# Patient Record
Sex: Female | Born: 2000 | Race: Black or African American | Hispanic: No | Marital: Single | Smoking: Never smoker
Health system: Southern US, Community
[De-identification: ages and names within clinical notes are randomized; demographics above are authoritative.]

## PROBLEM LIST (undated history)

## (undated) DIAGNOSIS — F329 Major depressive disorder, single episode, unspecified: Secondary | ICD-10-CM

## (undated) DIAGNOSIS — F419 Anxiety disorder, unspecified: Secondary | ICD-10-CM

## (undated) DIAGNOSIS — Z789 Other specified health status: Secondary | ICD-10-CM

## (undated) DIAGNOSIS — F32A Depression, unspecified: Secondary | ICD-10-CM

## (undated) HISTORY — DX: Depression, unspecified: F32.A

## (undated) HISTORY — DX: Major depressive disorder, single episode, unspecified: F32.9

## (undated) HISTORY — DX: Anxiety disorder, unspecified: F41.9

---
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
## 2021-05-28 ENCOUNTER — Encounter: Payer: Self-pay | Admitting: Emergency Medicine

## 2021-05-28 ENCOUNTER — Emergency Department: Payer: BC Managed Care – PPO

## 2021-05-28 ENCOUNTER — Other Ambulatory Visit: Payer: Self-pay

## 2021-05-28 ENCOUNTER — Emergency Department
Admission: EM | Admit: 2021-05-28 | Discharge: 2021-05-28 | Disposition: A | Discharge status: Home / Self Care | Payer: BC Managed Care – PPO | Attending: Emergency Medicine | Admitting: Emergency Medicine

## 2021-05-28 DIAGNOSIS — Z7722 Contact with and (suspected) exposure to environmental tobacco smoke (acute) (chronic): Secondary | ICD-10-CM | POA: Insufficient documentation

## 2021-05-28 DIAGNOSIS — N12 Tubulo-interstitial nephritis, not specified as acute or chronic: Secondary | ICD-10-CM | POA: Diagnosis not present

## 2021-05-28 DIAGNOSIS — R319 Hematuria, unspecified: Secondary | ICD-10-CM | POA: Diagnosis present

## 2021-05-28 LAB — URINALYSIS, COMPLETE (UACMP) WITH MICROSCOPIC
Bacteria, UA: NONE SEEN
RBC / HPF: >50 RBC/hpf — ABNORMAL HIGH (ref 0–5)
Specific Gravity, Urine: 1.024 (ref 1.005–1.030)
Squamous Epithelial / HPF: NONE SEEN (ref 0–5)
WBC, UA: >50 WBC/hpf — ABNORMAL HIGH (ref 0–5)

## 2021-05-28 LAB — PREGNANCY, URINE: Preg Test, Ur: NEGATIVE

## 2021-05-28 MED ORDER — PHENAZOPYRIDINE HCL 100 MG PO TABS
95.0000 mg | ORAL_TABLET | Freq: Once | ORAL | Status: AC
  Administered 2021-05-28: 100 mg via ORAL
  Filled 2021-05-28: qty 1

## 2021-05-28 MED ORDER — LEVOFLOXACIN 750 MG PO TABS: 750.0000 mg | ORAL_TABLET | Freq: Every day | ORAL | 0 refills | Status: AC

## 2021-05-28 MED ORDER — LEVOFLOXACIN 750 MG PO TABS
750.0000 mg | ORAL_TABLET | Freq: Once | ORAL | Status: AC
  Administered 2021-05-28: 750 mg via ORAL
  Filled 2021-05-28: qty 1

## 2021-05-28 MED ORDER — PHENAZOPYRIDINE HCL 100 MG PO TABS: 100.0000 mg | ORAL_TABLET | Freq: Three times a day (TID) | ORAL | 0 refills | Status: DC | PRN

## 2021-05-28 NOTE — ED Notes (Signed)
Awaiting medication from pharmacy for discharge.

## 2021-05-28 NOTE — Discharge Instructions (Addendum)
Take antibiotic as prescribed. Return to ER with any worsening.

## 2021-05-28 NOTE — ED Triage Notes (Signed)
Pt reports some dysuria and frequency and now hematuria and is concerned she has a UTI. Pt reports hx of the same.

## 2021-05-29 NOTE — ED Provider Notes (Signed)
[facility] Emergency Department Provider Note  ____________________________________________   Event Date/Time   First MD Initiated Contact with Patient 05/28/21 1833     (approximate)  I have reviewed the triage vital signs and the nursing notes.   HISTORY  Chief Complaint Dysuria and Hematuria   HPI Michele Howard is a 20 y.o. female who reports to the emergency department for urinary frequency, dysuria that started yesterday and hematuria that began today.  She reports that she has a history of "not feeling the UTIs until they are bad".  She denies any fever, has been nauseated but no vomiting.  Normal bowel movements.  She does report associated right flank pain.  She denies any vaginal discharge, says that she is on her placebo pill week of her birth control but does not believe this to be vaginal bleeding.  She denies any concern whatsoever for STDs, does not have any pelvic or suprapubic pain.       Past Medical History:  Diagnosis Date  . Anxiety   . Depression   . Medical history non-contributory     Patient Active Problem List   Diagnosis Date Noted  . MDD (major depressive disorder), single episode, severe , no psychosis (HCC) 10/31/2015    History reviewed. No pertinent surgical history.  Prior to Admission medications   Medication Sig Start Date End Date Taking? Authorizing Provider  levofloxacin (LEVAQUIN) 750 MG tablet Take 1 tablet (750 mg total) by mouth daily for 5 days. 05/28/21 06/02/21 Yes Jammy Plotkin, Ruben Gottron, PA  phenazopyridine (PYRIDIUM) 100 MG tablet Take 1 tablet (100 mg total) by mouth 3 (three) times daily as needed for pain. 05/28/21 05/28/22 Yes Lucy Chris, PA  acetaminophen (TYLENOL) 325 MG tablet Take 325-650 mg by mouth every 6 (six) hours as needed for mild pain or headache.    [provider]  hydrOXYzine (ATARAX/VISTARIL) 25 MG tablet Take 1 tablet (25 mg total) by mouth 3 (three) times daily as needed for  anxiety. Patient not taking: Reported on 06/15/2020 07/25/17   Rebecka Apley, MD  norethindrone-ethinyl estradiol (JUNEL FE 1/20) 1-20 MG-MCG tablet Take 1 tablet by mouth daily. 06/15/20   Doreene Burke, CNM  ondansetron (ZOFRAN ODT) 4 MG disintegrating tablet Take 1 tablet (4 mg total) by mouth every 8 (eight) hours as needed for nausea or vomiting. Patient not taking: Reported on 06/15/2020 01/21/19   Jene Every, MD  sertraline (ZOLOFT) 25 MG tablet  07/01/18   [provider]  sertraline (ZOLOFT) 50 MG tablet Take 50 mg by mouth daily. Patient not taking: Reported on 06/15/2020 05/28/18   [provider]    Allergies Patient has no known allergies.  Family History  Problem Relation Age of Onset  . Diabetes Maternal Grandfather   . Thyroid disease Paternal Grandmother     Social History Social History   Tobacco Use  . Smoking status: Passive Smoke Exposure - Never Smoker  . Smokeless tobacco: Never Used  Substance Use Topics  . Alcohol use: No  . Drug use: No    Review of Systems Constitutional: No fever/chills Eyes: No visual changes. ENT: No sore throat. Cardiovascular: Denies chest pain. Respiratory: Denies shortness of breath. Gastrointestinal: No abdominal pain.  + nausea, no vomiting.  No diarrhea.  No constipation. + Right flank pain Genitourinary: +dysuria, + hematuria Musculoskeletal: Negative for back pain. Skin: Negative for rash. Neurological: Negative for headaches, focal weakness or numbness.   ____________________________________________   PHYSICAL EXAM:  VITAL SIGNS: ED Triage Vitals  Enc Vitals Group     BP 05/28/21 1753 (!) 116/96     Pulse Rate 05/28/21 1753 64     Resp 05/28/21 1753 18     Temp 05/28/21 1753 98.2 F (36.8 C)     Temp Source 05/28/21 1753 Oral     SpO2 05/28/21 1753 100 %     Weight 05/28/21 1759 113 lb (51.3 kg)     Height 05/28/21 1759 5' (1.524 m)     Head Circumference --      Peak Flow --       Pain Score 05/28/21 1759 4     Pain Loc --      Pain Edu? --      Excl. in GC? --    Constitutional: Alert and oriented. Well appearing and in no acute distress. Eyes: Conjunctivae are normal. PERRL. EOMI. Head: Atraumatic. Nose: No congestion/rhinnorhea. Mouth/Throat: Mucous membranes are moist.  Oropharynx non-erythematous. Neck: No stridor.   Cardiovascular: Normal rate, regular rhythm. Grossly normal heart sounds.  Good peripheral circulation. Respiratory: Normal respiratory effort.  No retractions. Lungs CTAB. Gastrointestinal: Soft and nontender. No distention. No abdominal bruits. + Right-sided CVA tenderness, no left-sided CVA tenderness. Musculoskeletal: No lower extremity tenderness nor edema.  No joint effusions. Neurologic:  Normal speech and language. No gross focal neurologic deficits are appreciated. No gait instability. Skin:  Skin is warm, dry and intact. No rash noted. Psychiatric: Mood and affect are normal. Speech and behavior are normal.  ____________________________________________   LABS (all labs ordered are listed, but only abnormal results are displayed)  Labs Reviewed  URINALYSIS, COMPLETE (UACMP) WITH MICROSCOPIC - Abnormal; Notable for the following components:      Result Value   Color, Urine BROWN AND CLOUDY (*)    APPearance CLOUDY (*)    Glucose, UA   (*)    Value: TEST NOT REPORTED DUE TO COLOR INTERFERENCE OF URINE PIGMENT   Hgb urine dipstick   (*)    Value: TEST NOT REPORTED DUE TO COLOR INTERFERENCE OF URINE PIGMENT   Bilirubin Urine   (*)    Value: TEST NOT REPORTED DUE TO COLOR INTERFERENCE OF URINE PIGMENT   Ketones, ur   (*)    Value: TEST NOT REPORTED DUE TO COLOR INTERFERENCE OF URINE PIGMENT   Protein, ur   (*)    Value: TEST NOT REPORTED DUE TO COLOR INTERFERENCE OF URINE PIGMENT   Nitrite   (*)    Value: TEST NOT REPORTED DUE TO COLOR INTERFERENCE OF URINE PIGMENT   Leukocytes,Ua   (*)    Value: TEST NOT REPORTED DUE TO COLOR  INTERFERENCE OF URINE PIGMENT   RBC / HPF >50 (*)    WBC, UA >50 (*)    All other components within normal limits  URINE CULTURE  PREGNANCY, URINE    ____________________________________________  RADIOLOGY  Official radiology report(s): CT Renal Stone Study  Result Date: 05/28/2021 CLINICAL DATA:  Flank pain, dysuria and frequency.  Hematuria. EXAM: CT ABDOMEN AND PELVIS WITHOUT CONTRAST TECHNIQUE: Multidetector CT imaging of the abdomen and pelvis was performed following the standard protocol without IV contrast. COMPARISON:  None. FINDINGS: Lower chest: No acute abnormality. Hepatobiliary: No focal liver abnormality is seen. No gallstones, gallbladder wall thickening, or biliary dilatation. Pancreas: Unremarkable, partially obscured by surrounding bowel. Spleen: Normal in size without focal abnormality. Adrenals/Urinary Tract: Adrenal glands are unremarkable. Kidneys are unremarkable without stone or hydronephrosis. No perinephric fluid. No ureteral  or bladder calculi are identified. Bladder is unremarkable, partially decompressed. Stomach/Bowel: No dilated large or small bowel loops. Visualized portions of the appendix appear normal, partially obscured by overlapping non-opacified bowel loops. Stomach is unremarkable. Vascular/Lymphatic: No significant vascular findings are present. No enlarged abdominal or pelvic lymph nodes. Reproductive: No adnexal mass or free fluid is identified. Other: No free fluid or abscess collection is identified. No free intraperitoneal air. Musculoskeletal: No acute or significant osseous findings. IMPRESSION: No acute findings within the abdomen or pelvis. No renal or ureteral calculi. No hydronephrosis or perinephric fluid. No bowel obstruction or evidence of bowel wall inflammation. Visualized portions of the appendix appear normal. Electronically Signed   By: Bary Richard M.D.   On: 05/28/2021 19:57    ____________________________________________   INITIAL  IMPRESSION / ASSESSMENT AND PLAN / ED COURSE  As part of my medical decision making, I reviewed the following data within the electronic MEDICAL RECORD NUMBER Nursing notes reviewed and incorporated, Labs reviewed and Notes from prior ED visits        Patient is a 20 year old female who reports to the emergency department with a reported history of frequent UTIs who comes in for dysuria and frequency that began yesterday with hematuria that began today.  She reports associated right flank pain with no concern for STDs, no pelvic pain, vaginal discharge or suprapubic pain.  In triage, she has grossly normal vital signs.  Urinalysis was obtained and demonstrates greater than 50 white cells, greater than 50 red cells, no bacteria, however remainder of significant findings were not able to be reported due to the coloration of her urine.  Given her right flank pain with no obvious bacteria but large number of red and white cells, CT renal stone study was obtained to rule out renal stone as the cause of her symptoms.  This was negative.  Suspect that the patient has early Pyelonephritis given her nausea, right flank pain and large number of red and white cells.  Patient reiterates to me again that she has no concern for STDs and would not like testing today.  We will begin treatment with oral antibiotics and recommend close PCP follow-up.  Return precautions were discussed and patient is amenable with plan, she stable time for outpatient management.      ____________________________________________   FINAL CLINICAL IMPRESSION(S) / ED DIAGNOSES  Final diagnoses:  Pyelonephritis     ED Discharge Orders         Ordered    levofloxacin (LEVAQUIN) 750 MG tablet  Daily        05/28/21 2103    phenazopyridine (PYRIDIUM) 100 MG tablet  3 times daily PRN        05/28/21 2103           Note:  This document was prepared using Dragon voice recognition software and may include unintentional dictation  errors.   Lucy Chris, PA 05/29/21 1513    Sharman Cheek, MD 05/29/21 [phone]

## 2021-05-30 LAB — URINE CULTURE

## 2021-06-12 ENCOUNTER — Other Ambulatory Visit: Payer: Self-pay | Admitting: Certified Nurse Midwife

## 2021-06-13 ENCOUNTER — Telehealth: Payer: Self-pay | Admitting: Certified Nurse Midwife

## 2021-06-13 NOTE — Telephone Encounter (Signed)
Patient called asking about RX - states that she needs refills could not provide name of medication. Please Advise.

## 2021-06-13 NOTE — Telephone Encounter (Signed)
Called patient to inform her that 1 month of birth control pills have been sent to pharmacy. She has an appointment with Pattricia Boss this week.

## 2021-06-21 ENCOUNTER — Ambulatory Visit (INDEPENDENT_AMBULATORY_CARE_PROVIDER_SITE_OTHER): Payer: BC Managed Care – PPO | Admitting: Certified Nurse Midwife

## 2021-06-21 ENCOUNTER — Other Ambulatory Visit: Payer: Self-pay

## 2021-06-21 ENCOUNTER — Encounter: Payer: Self-pay | Admitting: Certified Nurse Midwife

## 2021-06-21 VITALS — BP 122/81 | HR 97 | Ht 62.0 in | Wt 118.9 lb

## 2021-06-21 DIAGNOSIS — Z01419 Encounter for gynecological examination (general) (routine) without abnormal findings: Secondary | ICD-10-CM

## 2021-06-21 DIAGNOSIS — Z113 Encounter for screening for infections with a predominantly sexual mode of transmission: Secondary | ICD-10-CM

## 2021-06-21 MED ORDER — LARIN 24 FE 1-20 MG-MCG(24) PO TABS: 1.0000 | ORAL_TABLET | Freq: Every day | ORAL | 11 refills | Status: DC

## 2021-06-21 NOTE — Progress Notes (Signed)
GYNECOLOGY ANNUAL PREVENTATIVE CARE ENCOUNTER NOTE  History:     Michele Howard is a 20 y.o. G0P0000 female here for a routine annual gynecologic exam.  Current complaints: none.   Denies abnormal vaginal bleeding, discharge, pelvic pain, problems with intercourse or other gynecologic concerns.     Social Relationship: not currently in relationship ( female & female partners) Living: with parents Work: Facilities manager  Exercise: none Smoke/Alcohol/drug use: denies   Gynecologic History Patient's last menstrual period was 05/21/2021 (approximate). Contraception: OCP (estrogen/progesterone) Last Pap: n/a . R Last mammogram: n/a   The pregnancy intention screening data noted above was reviewed. Potential methods of contraception were discussed. The patient elected to proceed with Oral Contraceptive.    Obstetric History OB History  Gravida Para Term Preterm AB Living  0 0 0 0 0 0  SAB IAB Ectopic Multiple Live Births  0 0 0 0 0    Past Medical History:  Diagnosis Date   Anxiety    Depression    Medical history non-contributory     History reviewed. No pertinent surgical history.  Current Outpatient Medications on File Prior to Visit  Medication Sig Dispense Refill   acetaminophen (TYLENOL) 325 MG tablet Take 325-650 mg by mouth every 6 (six) hours as needed for mild pain or headache.     LARIN 24 FE 1-20 MG-MCG(24) tablet Take 1 tablet by mouth once daily 28 tablet 0   hydrOXYzine (ATARAX/VISTARIL) 25 MG tablet Take 1 tablet (25 mg total) by mouth 3 (three) times daily as needed for anxiety. (Patient not taking: No sig reported) 15 tablet 0   ondansetron (ZOFRAN ODT) 4 MG disintegrating tablet Take 1 tablet (4 mg total) by mouth every 8 (eight) hours as needed for nausea or vomiting. (Patient not taking: No sig reported) 20 tablet 0   phenazopyridine (PYRIDIUM) 100 MG tablet Take 1 tablet (100 mg total) by mouth 3 (three) times daily as needed for pain. (Patient not  taking: Reported on 06/21/2021) 20 tablet 0   sertraline (ZOLOFT) 25 MG tablet  (Patient not taking: No sig reported)  1   sertraline (ZOLOFT) 50 MG tablet Take 50 mg by mouth daily. (Patient not taking: No sig reported)  1   No current facility-administered medications on file prior to visit.    No Known Allergies  Social History:  reports that she has never smoked. She has been exposed to tobacco smoke. She has never used smokeless tobacco. She reports that she does not drink alcohol and does not use drugs.  Family History  Problem Relation Age of Onset   Diabetes Maternal Grandfather    Thyroid disease Paternal Grandmother     The following portions of the patient's history were reviewed and updated as appropriate: allergies, current medications, past family history, past medical history, past social history, past surgical history and problem list.  Review of Systems Pertinent items noted in HPI and remainder of comprehensive ROS otherwise negative.  Physical Exam:  BP 122/81   Pulse 97   Ht 5\' 2"  (1.575 m)   Wt 118 lb 14.4 oz (53.9 kg)   LMP 05/21/2021 (Approximate)   BMI 21.75 kg/m  CONSTITUTIONAL: Well-developed, well-nourished female in no acute distress.  HENT:  Normocephalic, atraumatic, External right and left ear normal. Oropharynx is clear and moist EYES: Conjunctivae and EOM are normal. Pupils are equal, round, and reactive to light. No scleral icterus.  NECK: Normal range of motion, supple, no masses.  Normal thyroid.  SKIN: Skin is warm and dry. No rash noted. Not diaphoretic. No erythema. No pallor. MUSCULOSKELETAL: Normal range of motion. No tenderness.  No cyanosis, clubbing, or edema.  2+ distal pulses. NEUROLOGIC: Alert and oriented to person, place, and time. Normal reflexes, muscle tone coordination.  PSYCHIATRIC: Normal mood and affect. Normal behavior. Normal judgment and thought content. CARDIOVASCULAR: Normal heart rate noted, regular  rhythm RESPIRATORY: Clear to auscultation bilaterally. Effort and breath sounds normal, no problems with respiration noted. BREASTS: Symmetric in size. No masses, tenderness, skin changes, nipple drainage, or lymphadenopathy bilaterally.  ABDOMEN: Soft, no distention noted.  No tenderness, rebound or guarding.  PELVIC: Declines.  .   Assessment and Plan:  Annual Well Women GYN Exam   Pap: not due Mammogram : n/a  Labs: declines blood work , chlamydia urine testing  Refills: ocp Referral: none Pt state she had HPV vaccine unsure of dates.  Routine preventative health maintenance measures emphasized. Please refer to After Visit Summary for other counseling recommendations.      Doreene Burke, CNM [facility],  [facility]

## 2021-06-23 LAB — GC/CHLAMYDIA PROBE AMP
Chlamydia trachomatis, NAA: NEGATIVE
Neisseria Gonorrhoeae by PCR: NEGATIVE

## 2021-12-30 ENCOUNTER — Other Ambulatory Visit: Payer: Self-pay

## 2021-12-30 DIAGNOSIS — R52 Pain, unspecified: Secondary | ICD-10-CM | POA: Insufficient documentation

## 2021-12-30 DIAGNOSIS — R55 Syncope and collapse: Secondary | ICD-10-CM | POA: Insufficient documentation

## 2021-12-30 DIAGNOSIS — R531 Weakness: Secondary | ICD-10-CM | POA: Insufficient documentation

## 2021-12-30 DIAGNOSIS — Z20822 Contact with and (suspected) exposure to covid-19: Secondary | ICD-10-CM | POA: Insufficient documentation

## 2021-12-30 DIAGNOSIS — R197 Diarrhea, unspecified: Secondary | ICD-10-CM | POA: Diagnosis not present

## 2021-12-30 DIAGNOSIS — M545 Low back pain, unspecified: Secondary | ICD-10-CM | POA: Insufficient documentation

## 2021-12-30 DIAGNOSIS — R1110 Vomiting, unspecified: Secondary | ICD-10-CM | POA: Diagnosis not present

## 2021-12-30 DIAGNOSIS — R519 Headache, unspecified: Secondary | ICD-10-CM | POA: Insufficient documentation

## 2021-12-30 DIAGNOSIS — Z5321 Procedure and treatment not carried out due to patient leaving prior to being seen by health care provider: Secondary | ICD-10-CM | POA: Insufficient documentation

## 2021-12-30 LAB — RESP PANEL BY RT-PCR (FLU A&B, COVID) ARPGX2
Influenza A by PCR: NEGATIVE
Influenza B by PCR: NEGATIVE
SARS Coronavirus 2 by RT PCR: NEGATIVE

## 2021-12-30 LAB — CBC
HCT: 42.7 % (ref 36.0–46.0)
Hemoglobin: 14.4 g/dL (ref 12.0–15.0)
MCH: 29.9 pg (ref 26.0–34.0)
MCHC: 33.7 g/dL (ref 30.0–36.0)
MCV: 88.6 fL (ref 80.0–100.0)
Platelets: 298 10*3/uL (ref 150–400)
RBC: 4.82 MIL/uL (ref 3.87–5.11)
RDW: 12.4 % (ref 11.5–15.5)
WBC: 8.5 10*3/uL (ref 4.0–10.5)
nRBC: 0 % (ref 0.0–0.2)

## 2021-12-30 LAB — BASIC METABOLIC PANEL
Anion gap: 7 (ref 5–15)
BUN: 9 mg/dL (ref 6–20)
CO2: 24 mmol/L (ref 22–32)
Calcium: 9.3 mg/dL (ref 8.9–10.3)
Chloride: 104 mmol/L (ref 98–111)
Creatinine, Ser: 1.02 mg/dL — ABNORMAL HIGH (ref 0.44–1.00)
GFR, Estimated: >60 mL/min (ref >60)
Glucose, Bld: 104 mg/dL — ABNORMAL HIGH (ref 70–99)
Potassium: 3.6 mmol/L (ref 3.5–5.1)
Sodium: 135 mmol/L (ref 135–145)

## 2021-12-30 NOTE — ED Triage Notes (Signed)
Pt states weakness that began at work today. Pt states has had emesis x1 today. Pt is very difficult to get information from, states she is having some diarrhea and some generalized body aches. Pt denies dysuria.

## 2021-12-30 NOTE — ED Notes (Signed)
Pt to ED with father who states that pt was at work and started getting sick. Pt told her dad that she is feeling weak and feels like everything in her body is shutting down

## 2021-12-31 ENCOUNTER — Other Ambulatory Visit: Payer: Self-pay

## 2021-12-31 ENCOUNTER — Emergency Department
Admission: EM | Admit: 2021-12-31 | Discharge: 2021-12-31 | Disposition: A | Discharge status: Home / Self Care | Payer: BC Managed Care – PPO | Source: Home / Self Care | Attending: Emergency Medicine | Admitting: Emergency Medicine

## 2021-12-31 ENCOUNTER — Emergency Department: Payer: BC Managed Care – PPO

## 2021-12-31 ENCOUNTER — Encounter: Payer: Self-pay | Admitting: Intensive Care

## 2021-12-31 ENCOUNTER — Emergency Department
Admission: EM | Admit: 2021-12-31 | Discharge: 2021-12-31 | Disposition: A | Discharge status: Home / Self Care | Payer: BC Managed Care – PPO | Attending: Emergency Medicine | Admitting: Emergency Medicine

## 2021-12-31 DIAGNOSIS — M545 Low back pain, unspecified: Secondary | ICD-10-CM | POA: Insufficient documentation

## 2021-12-31 DIAGNOSIS — R55 Syncope and collapse: Secondary | ICD-10-CM

## 2021-12-31 DIAGNOSIS — G8929 Other chronic pain: Secondary | ICD-10-CM | POA: Insufficient documentation

## 2021-12-31 DIAGNOSIS — R42 Dizziness and giddiness: Secondary | ICD-10-CM | POA: Insufficient documentation

## 2021-12-31 DIAGNOSIS — R519 Headache, unspecified: Secondary | ICD-10-CM | POA: Insufficient documentation

## 2021-12-31 LAB — URINALYSIS, ROUTINE W REFLEX MICROSCOPIC
Bilirubin Urine: NEGATIVE
Glucose, UA: NEGATIVE mg/dL
Hgb urine dipstick: NEGATIVE
Ketones, ur: 5 mg/dL — AB
Nitrite: NEGATIVE
Protein, ur: 30 mg/dL — AB
Specific Gravity, Urine: 1.042 — ABNORMAL HIGH (ref 1.005–1.030)
pH: 5 (ref 5.0–8.0)

## 2021-12-31 LAB — POC URINE PREG, ED: Preg Test, Ur: NEGATIVE

## 2021-12-31 MED ORDER — IBUPROFEN 400 MG PO TABS
400.0000 mg | ORAL_TABLET | Freq: Once | ORAL | Status: AC
  Administered 2021-12-31: 400 mg via ORAL
  Filled 2021-12-31: qty 1

## 2021-12-31 MED ORDER — ACETAMINOPHEN 500 MG PO TABS
1000.0000 mg | ORAL_TABLET | Freq: Once | ORAL | Status: AC
  Administered 2021-12-31: 1000 mg via ORAL
  Filled 2021-12-31: qty 2

## 2021-12-31 NOTE — ED Provider Notes (Signed)
[facility] Provider Note    Event Date/Time   First MD Initiated Contact with Patient 12/31/21 1552     (approximate)   History   Near Syncope   HPI  Michele Howard is a 21 y.o. female with a past medical history of migraine headaches and some chronic low back pain that she states has been present for nearly over a year who presents accompanied by partner for assessment of headache and near syncopal episode yesterday and today.  Patient states she was feeling lightheaded at work and then developed a headache afterwards.  She states she has not had an appetite in a long time and thinks maybe little dehydrated this could be contributing.  She states she also felt lightheaded earlier today.  She denies any actual loss of consciousness.  No recent trauma injuries or falls.  She states her headache today is little different than her usual migraine headaches.  She denies any vertigo, vision changes, change in chronic back pain, chest pain, cough, shortness of breath, fevers, vomiting, diarrhea, urinary symptoms or rash.  She is not on any daily medications and denies EtOH or illicit drug use.  No other acute concerns at this time.  She did come to the ED yesterday to be seen but left prior to being seen due to long wait.      Physical Exam  Triage Vital Signs: ED Triage Vitals  Enc Vitals Group     BP 12/31/21 1253 127/76     Pulse Rate 12/31/21 1253 69     Resp 12/31/21 1253 16     Temp 12/31/21 1253 98.3 F (36.8 C)     Temp Source 12/31/21 1253 Oral     SpO2 12/31/21 1253 99 %     Weight 12/31/21 1250 122 lb (55.3 kg)     Height 12/31/21 1250 5\' 1"  (1.549 m)     Head Circumference --      Peak Flow --      Pain Score 12/31/21 1250 6     Pain Loc --      Pain Edu? --      Excl. in GC? --     Most recent vital signs: Vitals:   12/31/21 1253  BP: 127/76  Pulse: 69  Resp: 16  Temp: 98.3 F (36.8 C)  SpO2: 99%    General: Awake, no distress.   CV:  Good peripheral perfusion.  2+ radial pulses.  No murmurs rubs or gallops. Resp:  Normal effort.  Clear bilaterally. Abd:  No distention.  Soft throughout. Other:  No CVA tenderness over the back.  Cranial nerves II through XII grossly intact.  No pronator drift.  No finger dysmetria.  Symmetric 5/5 strength of all extremities.  Sensation intact to light touch in all extremities.  Unremarkable unassisted gait.    ED Results / Procedures / Treatments  Labs (all labs ordered are listed, but only abnormal results are displayed) Labs Reviewed  POC URINE PREG, ED     EKG  EKG shows sinus rhythm with some sinus arrhythmia with unremarkable intervals and no clear evidence of acute ischemia or significant arrhythmia.   RADIOLOGY  CT head shows no evidence of hemorrhage, mass-effect, edema or any other clear acute intracranial process.  This was reviewed by myself.  I also reviewed with radiology's interpretation and agree with the findings.   PROCEDURES:  Critical Care performed: No  Procedures    MEDICATIONS ORDERED IN ED: Medications  acetaminophen (TYLENOL) tablet 1,000 mg (has no administration in time range)  ibuprofen (ADVIL) tablet 400 mg (has no administration in time range)     IMPRESSION / MDM / ASSESSMENT AND PLAN / ED COURSE  I reviewed the triage vital signs and the nursing notes.                              Differential diagnosis includes, but is not limited to orthostasis, vasovagal, atypical migraine, anemia, metabolic derangements, arrhythmia with a lower suspicion this time based on her history and exam for CVA, PE, dissection, drug withdrawal or acute intoxication.  I was able to review patient's labs drawn during her ED visit yesterday with patient leaving before being seen.  BMP showed at that time no significant electrolyte or metabolic derangements.  COVID influenza PCR is negative.  BC without evidence of leukocytosis or acute anemia.  UA  without evidence of cystitis or infection.  Today pregnancy test is negative and ECG shows no evidence of arrhythmia or ischemia.  No evidence on history or exam of deep space infection of the head or neck.  Low suspicion for venous sinus fibrosis.  Early certainly possible cellulitis and dehydration versus atypical migraine given stable vitals with reassuring exam work-up and patient tolerating p.o. I think she is stable for discharge with outpatient continued evaluation and follow-up.  Discharged in stable condition.  Strict return precautions advised and discussed.      FINAL CLINICAL IMPRESSION(S) / ED DIAGNOSES   Final diagnoses:  Near syncope  Nonintractable headache, unspecified chronicity pattern, unspecified headache type     Rx / DC Orders   ED Discharge Orders     None        Note:  This document was prepared using Dragon voice recognition software and may include unintentional dictation errors.   Gilles Chiquito, MD 12/31/21 [phone]

## 2021-12-31 NOTE — ED Provider Triage Note (Signed)
Emergency Medicine Provider Triage Evaluation Note  Michele Howard , a 21 y.o. female  was evaluated in triage.  Pt complains of near syncopal episode yesterday, evaluated in the ED for the same yesterday.  Awoke this morning with a headache..  Review of Systems  Positive: Headache, near syncopal episode yesterday Negative: Fever, chills, chest pain shortness of breath  Physical Exam  BP 127/76 (BP Location: Left Arm)    Pulse 69    Temp 98.3 F (36.8 C) (Oral)    Resp 16    Ht 5\' 1"  (1.549 m)    Wt 55.3 kg    LMP 12/25/2021 (Exact Date)    SpO2 99%    BMI 23.05 kg/m  Gen:   Awake, no distress   Resp:  Normal effort  MSK:   Moves extremities without difficulty  Other:    Medical Decision Making  Medically screening exam initiated at 12:57 PM.  Appropriate orders placed.  02/22/2022 was informed that the remainder of the evaluation will be completed by another provider, this initial triage assessment does not replace that evaluation, and the importance of remaining in the ED until their evaluation is complete.  Patient states she has had similar symptoms in the past.  I am not going to initiate repeat blood work at this time.  Will initiate CT of the head.  We will let another provider decide on labs   Ezequiel Ganser, Faythe Ghee 12/31/21 1258

## 2021-12-31 NOTE — ED Triage Notes (Signed)
Reports feeling faint at work yesterday and weak that started at 7pm last night. Reports headache today. Ambulatory with NAD

## 2022-03-05 ENCOUNTER — Ambulatory Visit (HOSPITAL_COMMUNITY)
Admission: EM | Admit: 2022-03-05 | Discharge: 2022-03-05 | Disposition: A | Discharge status: Home / Self Care | Payer: BC Managed Care – PPO | Attending: Urology | Admitting: Urology

## 2022-03-05 DIAGNOSIS — F331 Major depressive disorder, recurrent, moderate: Secondary | ICD-10-CM

## 2022-03-05 MED ORDER — HYDROXYZINE HCL 25 MG PO TABS: 25.0000 mg | ORAL_TABLET | Freq: Two times a day (BID) | ORAL | 1 refills | Status: DC | PRN

## 2022-03-05 MED ORDER — SERTRALINE HCL 25 MG PO TABS: 25.0000 mg | ORAL_TABLET | Freq: Every day | ORAL | 2 refills | Status: DC

## 2022-03-05 MED ORDER — SERTRALINE HCL 50 MG PO TABS: ORAL_TABLET | ORAL | 2 refills | Status: DC

## 2022-03-05 NOTE — Discharge Instructions (Signed)
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
## 2022-03-05 NOTE — ED Notes (Signed)
New prescriptions and discharge instructions reviewed with pt who verbalized understanding. ?

## 2022-03-05 NOTE — ED Provider Notes (Signed)
Behavioral Health Urgent Care Medical Screening Exam ? ?Patient Name: Michele Howard ?MRN: [id number] ?Date of Evaluation: 03/05/22 ?Chief Complaint:   ?Diagnosis:  ?Final diagnoses:  ?MDD (major depressive disorder), recurrent episode, moderate (HCC)  ? ? ?History of Present illness: Michele Howard is a 21 year old female with psychiatric history of suicidal ideation, depression, and anxiety.  Patient presented voluntarily to [facility] for a walk-in assessment.  Patient presented with chief complaint of worsening depressive symptoms. ? ?Patient was seen face-to-face and her chart was reviewed by this nurse practitioner.  On evaluation, patient is alert and oriented x4; she is calm and cooperative.  Patient is speaking in a normal tone of voice at moderate rate with good eye contact.  Patient's mood is depressed with congruent affect.  Her thought process is coherent.  Patient did not appear to be responding to any internal/external stimuli or experiencing any delusional thought content during this assessment. ? ?Patient reports that she was diagnosed with depression when she was 21 years old.  She reports that she was previously on Zoloft and was seeing a therapist. She reports had stopped taking Zoloft and seeing therapist about 2 years ago. She reports worsening depressive symptoms and anxiety over the past few weeks. She endorses depressive symptoms of isolation, crying spells, hopelessness, worthlessness, anxiety, irritability, poor energy, and poor focus.  ?She says she wants to be restarted on Zoloft as well as outpatient therapy.  ?Patient contracts for safety verbally with this provider.  She denies suicidal ideation, homicidal ideation, and auditory hallucination.  Patient endorses visual hallucination of "seeing shadows."  Patient reports that she used marijuana 2 months ago and drank alcoholic beverage once in january 2023.  Patient denies all other substance abuse.  Patient reports that she lives at home  with her parents. ? ?Psychiatric Specialty Exam ? ?Presentation  ?General Appearance:Appropriate for Environment ? ?Eye Contact:Good ? ?Speech:Clear and Coherent ? ?Speech Volume:Normal ? ?Handedness:Right ? ? ?Mood and Affect  ?Mood:Depressed; Anxious ? ?Affect:Congruent ? ? ?Thought Process  ?Thought Processes:Coherent ? ?Descriptions of Associations:Intact ? ?Orientation:Full (Time, Place and Person) ? ?Thought Content:WDL ?   Hallucinations:Visual ?"shadows" ? ?Ideas of Reference:None ? ?Suicidal Thoughts:No ? ?Homicidal Thoughts:No ? ? ?Sensorium  ?Memory:Immediate Good; Recent Good; Remote Good ? ?Judgment:Good ? ?Insight:Good ? ? ?Executive Functions  ?Concentration:Good ? ?Attention Span:Good ? ?Recall:Good ? ?Fund of Knowledge:Good ? ?Language:Good ? ? ?Psychomotor Activity  ?Psychomotor Activity:Normal ? ? ?Assets  ?Assets:Communication Skills; Desire for Improvement; Housing; Health and safety inspector; Physical Health; Social Support; Transportation; Vocational/Educational ? ? ?Sleep  ?Sleep:Fair ? ?Number of hours: 6 ? ? ?No data recorded ? ?Physical Exam: ?Physical Exam ?Vitals and nursing note reviewed.  ?Constitutional:   ?   General: She is not in acute distress. ?   Appearance: She is well-developed.  ?HENT:  ?   Head: Normocephalic and atraumatic.  ?Eyes:  ?   Conjunctiva/sclera: Conjunctivae normal.  ?Cardiovascular:  ?   Rate and Rhythm: Normal rate.  ?Pulmonary:  ?   Effort: Pulmonary effort is normal. No respiratory distress.  ?Abdominal:  ?   Palpations: Abdomen is soft.  ?   Tenderness: There is no abdominal tenderness.  ?Musculoskeletal:     ?   General: No swelling.  ?   Cervical back: Neck supple.  ?Skin: ?   General: Skin is warm and dry.  ?   Capillary Refill: Capillary refill takes less than 2 seconds.  ?Neurological:  ?   Mental Status: She is alert and oriented  to person, place, and time.  ?Psychiatric:     ?   Attention and Perception: Attention and perception normal.     ?    Mood and Affect: Mood is anxious and depressed.     ?   Speech: Speech normal.     ?   Behavior: Behavior normal. Behavior is cooperative.     ?   Thought Content: Thought content normal.     ?   Cognition and Memory: Cognition normal.  ? ?Review of Systems  ?Constitutional: Negative.   ?HENT: Negative.    ?Eyes: Negative.   ?Respiratory: Negative.    ?Cardiovascular: Negative.   ?Gastrointestinal: Negative.   ?Genitourinary: Negative.   ?Musculoskeletal: Negative.   ?Skin: Negative.   ?Neurological: Negative.   ?Endo/Heme/Allergies: Negative.   ?Psychiatric/Behavioral:  Positive for depression. The patient is nervous/anxious.   ?Blood pressure 135/79, pulse 75, temperature 98.9 ?F (37.2 ?C), temperature source Oral, resp. rate 18, SpO2 92 %. There is no height or weight on file to calculate BMI. ? ?Musculoskeletal: ?Strength & Muscle Tone: within normal limits ?Gait & Station: normal ?Patient leans: Right ? ? ?[facility] MSE Discharge Disposition for Follow up and Recommendations: ?Based on my evaluation the patient does not appear to have an emergency medical condition and can be discharged with resources and follow up care in outpatient services for Medication Management and Individual Therapy ? ? ?Maricela Bo, NP ?03/05/2022, 8:33 PM ? ?

## 2022-03-23 ENCOUNTER — Telehealth (HOSPITAL_COMMUNITY): Payer: Self-pay | Admitting: Pediatrics

## 2022-03-23 NOTE — BH Assessment (Signed)
Care Management - [facility]UC Follow Up Discharges  ° °Writer attempted to make contact with patient today and was unsuccessful.  Writer left a HIPPA compliant voice message.  ° °Per chart review, patient was provided with outpatient resources. ° °

## 2022-05-08 ENCOUNTER — Telehealth: Payer: Self-pay | Admitting: Certified Nurse Midwife

## 2022-05-08 ENCOUNTER — Other Ambulatory Visit: Payer: Self-pay

## 2022-05-08 MED ORDER — LARIN 24 FE 1-20 MG-MCG(24) PO TABS: 1.0000 | ORAL_TABLET | Freq: Every day | ORAL | 3 refills | Status: DC

## 2022-05-08 NOTE — Telephone Encounter (Signed)
Refill sent.

## 2022-05-08 NOTE — Telephone Encounter (Signed)
Pt is requesting refill on birth control pills- confirmed pharmacy as [facility] on [address]. Pt states that she lost her birth control and is wondering if she can get an emergency pack- states she lost it 2 days ago.  ?

## 2022-06-15 ENCOUNTER — Telehealth: Payer: Self-pay | Admitting: Certified Nurse Midwife

## 2022-06-15 ENCOUNTER — Encounter: Payer: Self-pay | Admitting: Certified Nurse Midwife

## 2022-06-15 NOTE — Telephone Encounter (Signed)
Attempted to call to make pt aware of canceled apt and inform about recall in fall after [employer]- automated response of 'not accepting calls at this time"

## 2022-06-26 ENCOUNTER — Encounter: Payer: BC Managed Care – PPO | Admitting: Certified Nurse Midwife

## 2022-08-16 ENCOUNTER — Encounter: Payer: Self-pay | Admitting: Emergency Medicine

## 2022-08-16 ENCOUNTER — Emergency Department: Payer: BC Managed Care – PPO

## 2022-08-16 ENCOUNTER — Emergency Department
Admission: EM | Admit: 2022-08-16 | Discharge: 2022-08-16 | Disposition: A | Discharge status: Home / Self Care | Payer: BC Managed Care – PPO | Attending: Emergency Medicine | Admitting: Emergency Medicine

## 2022-08-16 DIAGNOSIS — S161XXA Strain of muscle, fascia and tendon at neck level, initial encounter: Secondary | ICD-10-CM | POA: Diagnosis not present

## 2022-08-16 DIAGNOSIS — G44319 Acute post-traumatic headache, not intractable: Secondary | ICD-10-CM | POA: Diagnosis not present

## 2022-08-16 DIAGNOSIS — Y92410 Unspecified street and highway as the place of occurrence of the external cause: Secondary | ICD-10-CM | POA: Diagnosis not present

## 2022-08-16 DIAGNOSIS — S169XXA Unspecified injury of muscle, fascia and tendon at neck level, initial encounter: Secondary | ICD-10-CM | POA: Diagnosis present

## 2022-08-16 MED ORDER — METHOCARBAMOL 500 MG PO TABS: 500.0000 mg | ORAL_TABLET | Freq: Four times a day (QID) | ORAL | 0 refills | Status: DC

## 2022-08-16 MED ORDER — MELOXICAM 15 MG PO TABS: 15.0000 mg | ORAL_TABLET | Freq: Every day | ORAL | 0 refills | Status: AC
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
## 2022-08-23 IMAGING — CT CT HEAD W/O CM
4 series · 17 of 47 positions shown, 19 images · non-contrast
Comparison: None.

CLINICAL DATA: Headache.

EXAM:
CT HEAD WITHOUT CONTRAST
TECHNIQUE: Contiguous axial images were obtained from the base of the skull
through the vertex without intravenous contrast.

[Series 2: head wo · axial · 0.42mm/px · z∈[-131,-11]mm · 7 of 33 slices shown, 9 images]
[im 5/33  brain]
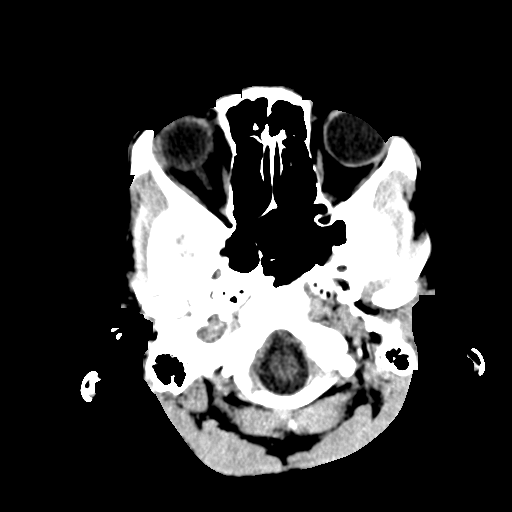
[im 5/33  bone]
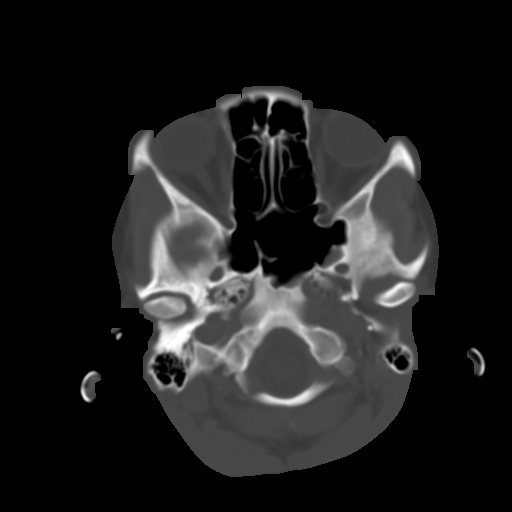
[im 9/33  brain]
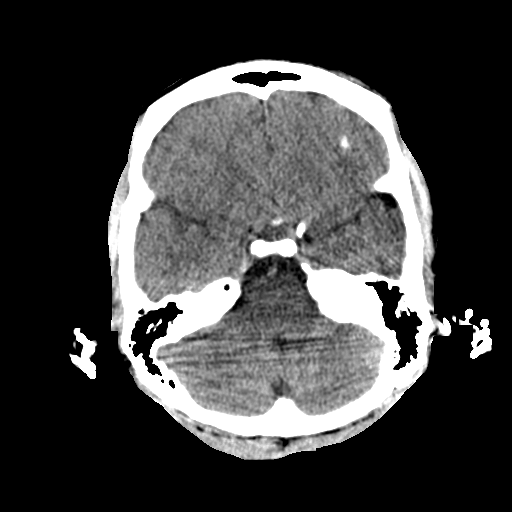
[im 13/33  brain]
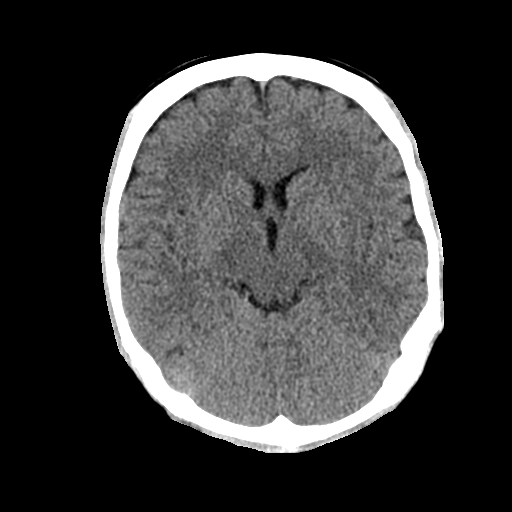
[im 17/33  brain]
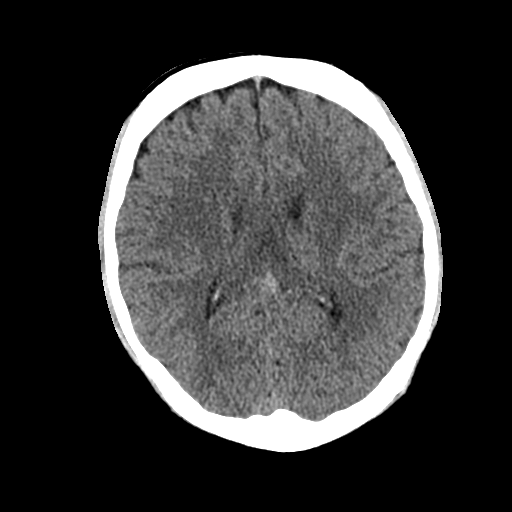
[im 21/33  brain]
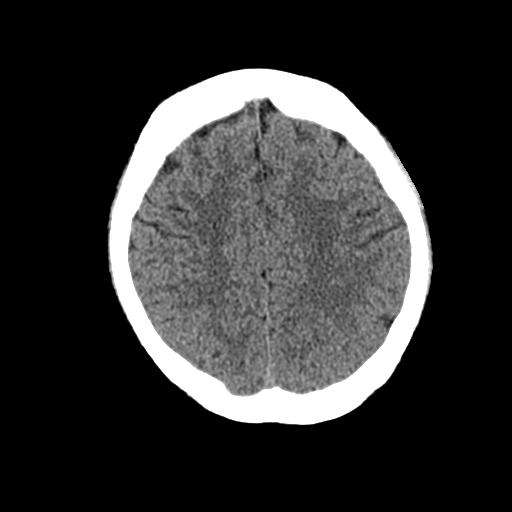
[im 21/33  bone]
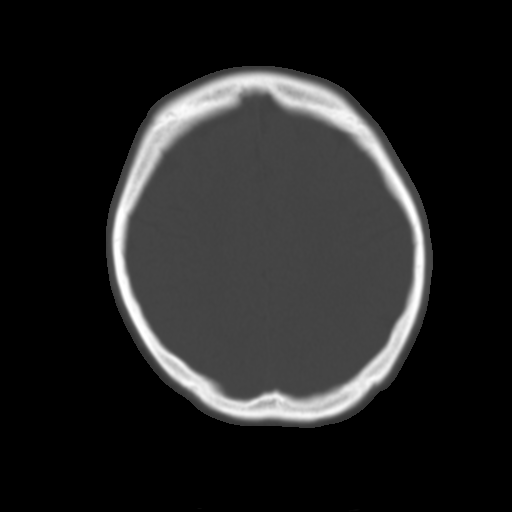
[im 25/33  brain]
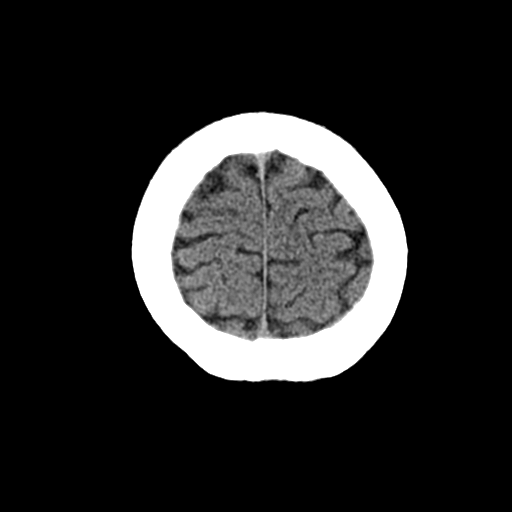
[im 29/33  brain]
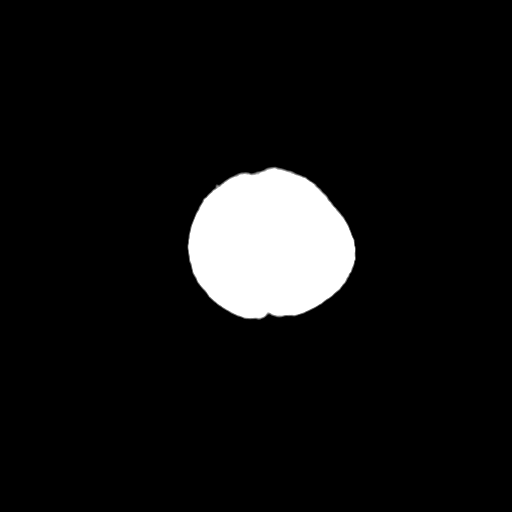

[Series 3: head bone · axial · 0.42mm/px · z∈[-135,-79]mm · 4 of 82 slices shown]
[im 9/82  bone]
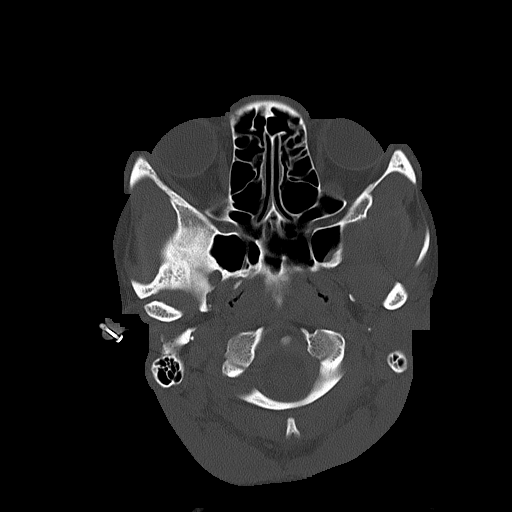
[im 17/82  bone]
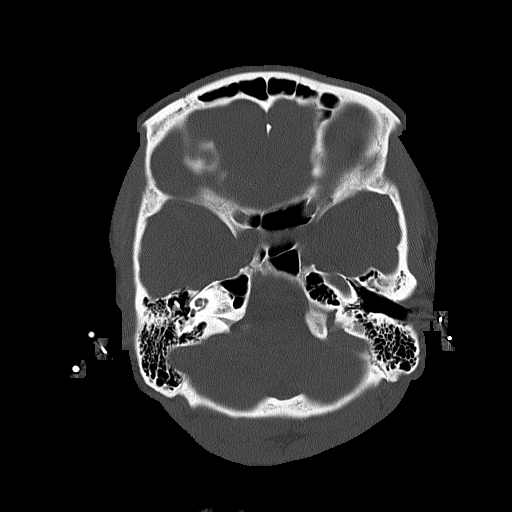
[im 25/82  bone]
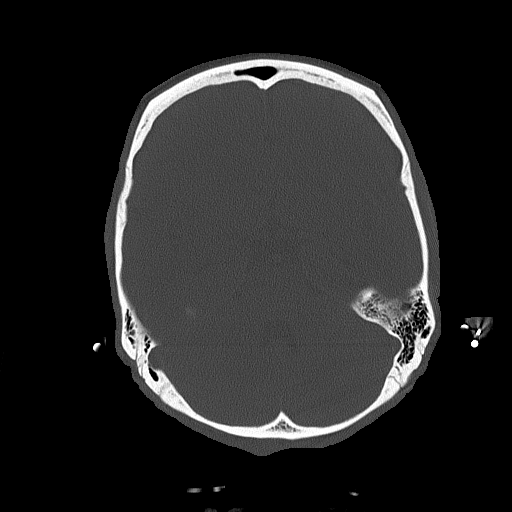
[im 37/82  bone]
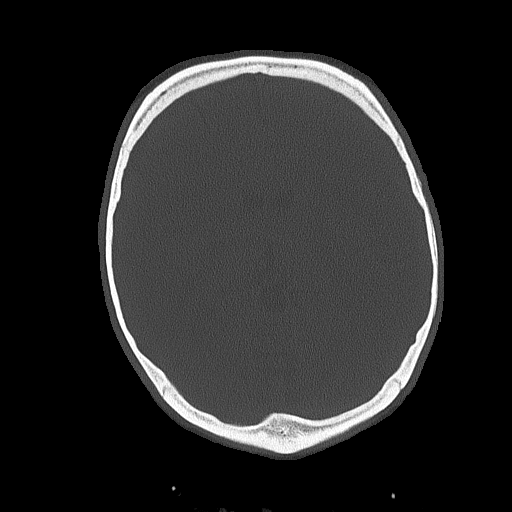

[Series 4: coronal soft tissue · coronal · 0.34mm/px · 3 of 70 slices shown]
[im 24/70  brain]
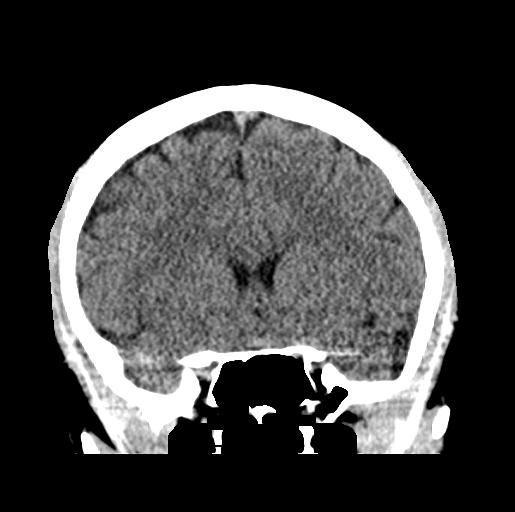
[im 31/70  brain]
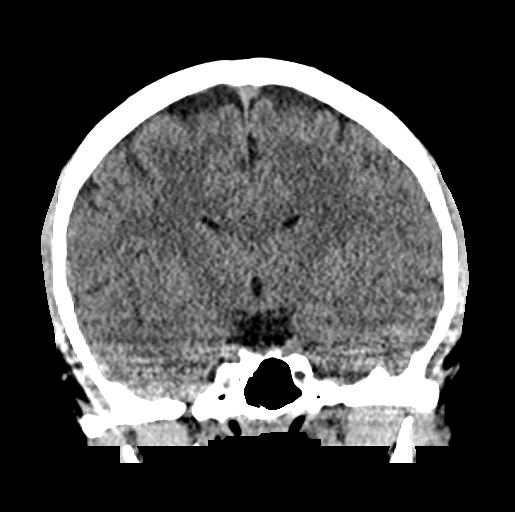
[im 39/70  brain]
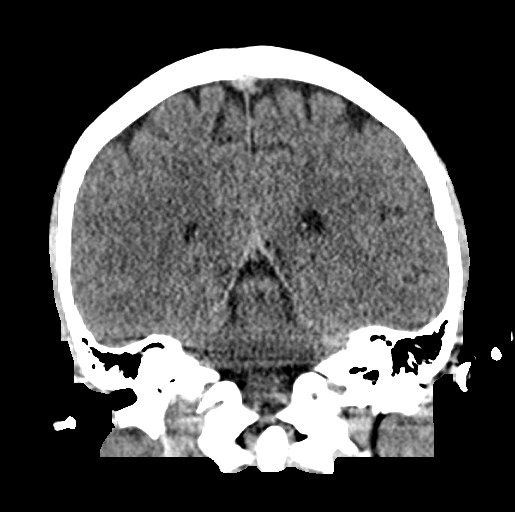

[Series 5: sagittal soft tissue · sagittal · 0.32mm/px · 3 of 60 slices shown]
[im 20/60  brain]
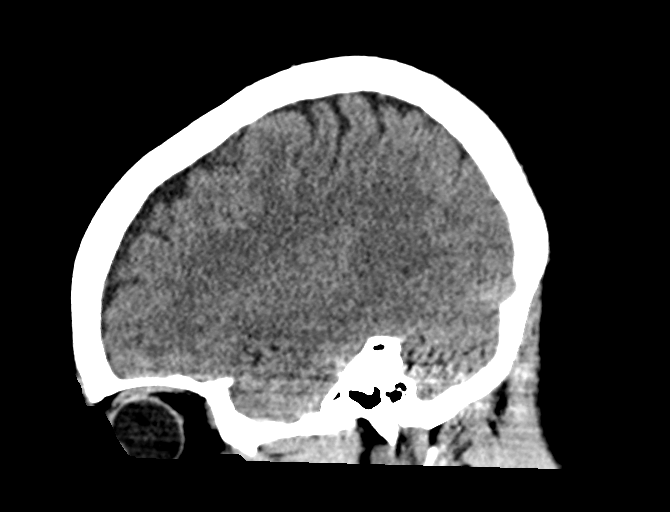
[im 30/60  brain]
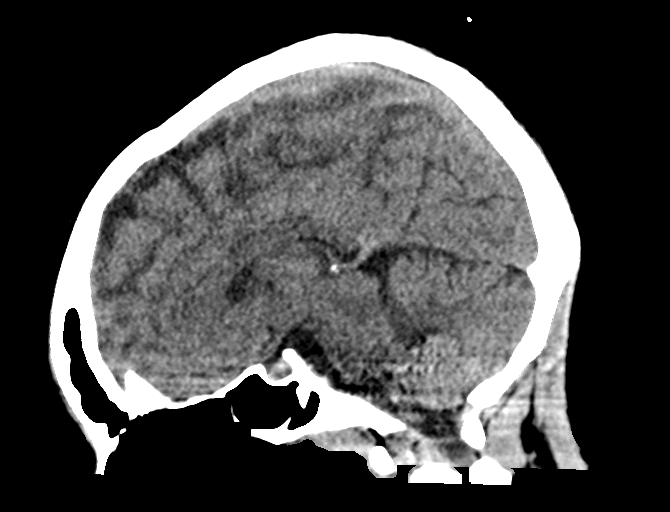
[im 40/60  brain]
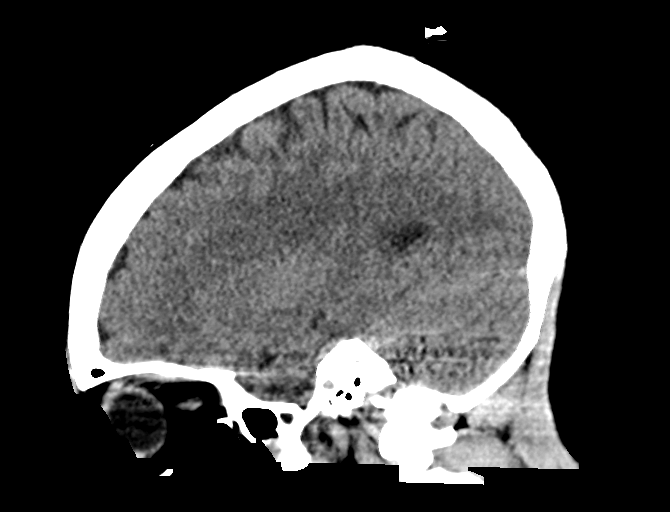

[17 of 47 positions shown; findings below may reference images not displayed]

FINDINGS: Brain: No evidence of acute infarction, hemorrhage, hydrocephalus,
extra-axial collection or mass lesion/mass effect.

Vascular: No hyperdense vessel or unexpected calcification.

Skull: Normal. Negative for fracture or focal lesion.

Sinuses/Orbits: Globes and orbits are unremarkable. Visualized
sinuses are clear.

Other: None.
IMPRESSION: Normal unenhanced CT scan of the brain.

## 2022-10-08 ENCOUNTER — Other Ambulatory Visit: Payer: Self-pay | Admitting: Certified Nurse Midwife

## 2022-10-09 ENCOUNTER — Other Ambulatory Visit: Payer: Self-pay

## 2022-10-09 ENCOUNTER — Telehealth: Payer: Self-pay

## 2022-10-09 MED ORDER — LARIN 24 FE 1-20 MG-MCG(24) PO TABS: 1.0000 | ORAL_TABLET | Freq: Every day | ORAL | 2 refills | Status: DC

## 2022-10-09 NOTE — Telephone Encounter (Signed)
Sophina called triage requesting a refill of birth control, told her she's due for her annual to get scheduled for her annual and then I can call her in some to hold her over until her appointment

## 2022-10-10 ENCOUNTER — Other Ambulatory Visit: Payer: Self-pay

## 2022-10-10 MED ORDER — LARIN 24 FE 1-20 MG-MCG(24) PO TABS: 1.0000 | ORAL_TABLET | Freq: Every day | ORAL | 0 refills | Status: DC

## 2022-11-08 ENCOUNTER — Other Ambulatory Visit (HOSPITAL_COMMUNITY)
Admission: RE | Admit: 2022-11-08 | Discharge: 2022-11-08 | Disposition: A | Discharge status: Home / Self Care | Payer: BC Managed Care – PPO | Source: Ambulatory Visit | Attending: Licensed Practical Nurse | Admitting: Licensed Practical Nurse

## 2022-11-08 ENCOUNTER — Encounter: Payer: Self-pay | Admitting: Licensed Practical Nurse

## 2022-11-08 ENCOUNTER — Ambulatory Visit (INDEPENDENT_AMBULATORY_CARE_PROVIDER_SITE_OTHER): Payer: BC Managed Care – PPO | Admitting: Licensed Practical Nurse

## 2022-11-08 VITALS — BP 107/67 | HR 91 | Ht 62.0 in | Wt 118.0 lb

## 2022-11-08 DIAGNOSIS — Z113 Encounter for screening for infections with a predominantly sexual mode of transmission: Secondary | ICD-10-CM

## 2022-11-08 DIAGNOSIS — N390 Urinary tract infection, site not specified: Secondary | ICD-10-CM

## 2022-11-08 DIAGNOSIS — Z01419 Encounter for gynecological examination (general) (routine) without abnormal findings: Secondary | ICD-10-CM | POA: Diagnosis present

## 2022-11-08 NOTE — Progress Notes (Signed)
Gynecology Annual Exam  PCP: Otilio Connors, MD  Chief Complaint:  Chief Complaint  Patient presents with  . Annual Exam    History of Present Illness: Patient is a 21 y.o. G0P0000 presents for annual exam. The patient has no complaints today.   LMP: Patient's last menstrual period was 11/08/2022. Menarche:{numbers 0-32:12248} Average Interval: {Desc; regular/irreg:14544}, {numbers 22-35:14824} days Duration of flow: {numbers; 0-10:33138} days Heavy Menses: {yes/no:63} Clots: {yes/no:63} Intermenstrual Bleeding: {yes/no:63} Postcoital Bleeding: {yes/no:63} Dysmenorrhea: {yes/no:63}  The patient {sys sexually active:13135} sexually active. She currently uses {method:5051} for contraception. She {has/denies:315300} dyspareunia.  The patient {DOES_DOES GNO:03704} perform self breast exams.  There {is/is no:19420} notable family history of breast or ovarian cancer in her family.  The patient wears seatbelts: {yes/no:63}.  The patient has regular exercise: {yes/no/not asked:9010}.    The patient {Blank single:19197::"repots","denies"} current symptoms of depression.    Review of Systems: ROS  Past Medical History:  Patient Active Problem List   Diagnosis Date Noted  . MDD (major depressive disorder), single episode, severe , no psychosis (HCC) 10/31/2015    Past Surgical History:  History reviewed. No pertinent surgical history.  Gynecologic History:  Patient's last menstrual period was 11/08/2022. Contraception: {method:5051} Last Pap: Results were: *** {Findings; lab pap smear results:16707::"NIL and HR HPV+","NIL and HR HPV negative"}   Obstetric History: G0P0000  Family History:  Family History  Problem Relation Age of Onset  . Diabetes Maternal Grandfather   . Thyroid disease Paternal Grandmother     Social History:  Social History   Socioeconomic History  . Marital status: Single    Spouse name: Not on file  . Number of children: Not on file  .  Years of education: Not on file  . Highest education level: Not on file  Occupational History  . Not on file  Tobacco Use  . Smoking status: Never    Passive exposure: Yes  . Smokeless tobacco: Never  Substance and Sexual Activity  . Alcohol use: No  . Drug use: Yes    Types: Marijuana  . Sexual activity: Yes    Birth control/protection: Pill  Other Topics Concern  . Not on file  Social History Narrative  . Not on file   Social Determinants of Health   Financial Resource Strain: Not on file  Food Insecurity: Not on file  Transportation Needs: Not on file  Physical Activity: Not on file  Stress: Not on file  Social Connections: Not on file  Intimate Partner Violence: Not on file    Allergies:  No Known Allergies  Medications: Prior to Admission medications   Medication Sig Start Date End Date Taking? Authorizing Provider  levofloxacin (LEVAQUIN) 750 MG tablet Take 750 mg by mouth daily. 11/06/22  Yes [provider]  Norethindrone Acetate-Ethinyl Estrad-FE (LARIN 24 FE) 1-20 MG-MCG(24) tablet Take 1 tablet by mouth daily. 10/10/22  Yes Doreene Burke, CNM  acetaminophen (TYLENOL) 325 MG tablet Take 325-650 mg by mouth every 6 (six) hours as needed for mild pain or headache. Patient not taking: Reported on 11/08/2022    [provider]  hydrOXYzine (ATARAX) 25 MG tablet Take 1 tablet (25 mg total) by mouth 2 (two) times daily as needed for anxiety. Patient not taking: Reported on 11/08/2022 03/05/22   Cecilio Asper A, NP  meloxicam (MOBIC) 15 MG tablet Take 1 tablet (15 mg total) by mouth daily. Patient not taking: Reported on 11/08/2022 08/16/22 08/16/23  Cuthriell, Delorise Royals, PA-C  methocarbamol (ROBAXIN)  500 MG tablet Take 1 tablet (500 mg total) by mouth 4 (four) times daily. Patient not taking: Reported on 11/08/2022 08/16/22   Cuthriell, Delorise Royals, PA-C  sertraline (ZOLOFT) 50 MG tablet Take 0.5 tablets (25 mg total) by mouth daily for 7 days, THEN 1  tablet (50 mg total) daily. 03/05/22 04/11/22  Maricela Bo, NP    Physical Exam Vitals: Blood pressure 107/67, pulse 91, height 5\' 2"  (1.575 m), weight 118 lb (53.5 kg), last menstrual period 11/08/2022.  General: NAD HEENT: normocephalic, anicteric Thyroid: no enlargement, no palpable nodules Pulmonary: No increased work of breathing, CTAB Cardiovascular: RRR, distal pulses 2+ Breast: Breast symmetrical, no tenderness, no palpable nodules or masses, no skin or nipple retraction present, no nipple discharge.  No axillary or supraclavicular lymphadenopathy. Abdomen: NABS, soft, non-tender, non-distended.  Umbilicus without lesions.  No hepatomegaly, splenomegaly or masses palpable. No evidence of hernia  Genitourinary:  External: Normal external female genitalia.  Normal urethral meatus, normal Bartholin's and Skene's glands.    Vagina: Normal vaginal mucosa, no evidence of prolapse.    Cervix: Grossly normal in appearance, no bleeding  Uterus: Non-enlarged, mobile, normal contour.  No CMT  Adnexa: ovaries non-enlarged, no adnexal masses  Rectal: deferred  Lymphatic: no evidence of inguinal lymphadenopathy Extremities: no edema, erythema, or tenderness Neurologic: Grossly intact Psychiatric: mood appropriate, affect full  Female chaperone present for pelvic and breast  portions of the physical exam    Assessment: 21 y.o. G0P0000 routine annual exam  Plan: Problem List Items Addressed This Visit   None Visit Diagnoses     Well woman exam    -  Primary   Relevant Orders   CBC w/Diff/Platelet   HEP, RPR, HIV Panel   Cervicovaginal ancillary only   Screening examination for venereal disease       Relevant Orders   HEP, RPR, HIV Panel   Cervicovaginal ancillary only       1) 4) Gardasil Series discussed and if applicable offered to patient - Patient {HAS HAS 26 previously completed 3 shot series   2) STI screening  {Blank single:19197::"was","was not"}offered  and {Blank single:19197::"accepted","declined","therefore not obtained"}  3)  ASCCP guidelines and rational discussed.  Patient opts for {Blank single:19197::"***","every 5 years","every 3 years","yearly","discontinue age >65","discontinue secondary to prior hysterectomy"} screening interval  4) Contraception - the patient is currently using  {method:5051}.  She is {Blank single:19197::"happy with her current form of contraception and plans to continue","interested in changing to ***","interested in starting Contraception: ***","not currently in need of contraception secondary to being sterile","attempting to conceive in the near future"} We discussed safe sex practices to reduce her furture risk of STI's.    5) No follow-ups on file.   [id number]}, CNM  [facility], [facility] 11/08/2022, 2:30 PM

## 2022-11-09 LAB — CBC WITH DIFFERENTIAL/PLATELET
Basophils Absolute: 0 10*3/uL (ref 0.0–0.2)
Basos: 0 %
EOS (ABSOLUTE): 0.1 10*3/uL (ref 0.0–0.4)
Eos: 1 %
Hematocrit: 40.5 % (ref 34.0–46.6)
Hemoglobin: 13.5 g/dL (ref 11.1–15.9)
Immature Grans (Abs): 0 10*3/uL (ref 0.0–0.1)
Immature Granulocytes: 0 %
Lymphocytes Absolute: 1.6 10*3/uL (ref 0.7–3.1)
Lymphs: 17 %
MCH: 29.3 pg (ref 26.6–33.0)
MCHC: 33.3 g/dL (ref 31.5–35.7)
MCV: 88 fL (ref 79–97)
Monocytes Absolute: 0.7 10*3/uL (ref 0.1–0.9)
Monocytes: 7 %
Neutrophils Absolute: 7 10*3/uL (ref 1.4–7.0)
Neutrophils: 75 %
Platelets: 301 10*3/uL (ref 150–450)
RBC: 4.61 x10E6/uL (ref 3.77–5.28)
RDW: 12.8 % (ref 11.7–15.4)
WBC: 9.4 10*3/uL (ref 3.4–10.8)

## 2022-11-09 LAB — HEP, RPR, HIV PANEL
HIV Screen 4th Generation wRfx: NONREACTIVE
Hepatitis B Surface Ag: NEGATIVE
RPR Ser Ql: NONREACTIVE

## 2022-11-09 MED ORDER — MISOPROSTOL 200 MCG PO TABS: ORAL_TABLET | ORAL | 2 refills | Status: DC

## 2022-11-10 ENCOUNTER — Other Ambulatory Visit: Payer: Self-pay | Admitting: Licensed Practical Nurse

## 2022-11-10 DIAGNOSIS — B9689 Other specified bacterial agents as the cause of diseases classified elsewhere: Secondary | ICD-10-CM

## 2022-11-10 LAB — CERVICOVAGINAL ANCILLARY ONLY
Bacterial Vaginitis (gardnerella): POSITIVE — AB
Candida Glabrata: NEGATIVE
Candida Vaginitis: NEGATIVE
Chlamydia: NEGATIVE
Comment: NEGATIVE
Comment: NEGATIVE
Comment: NEGATIVE
Comment: NEGATIVE
Comment: NEGATIVE
Comment: NORMAL
Neisseria Gonorrhea: NEGATIVE
Trichomonas: NEGATIVE

## 2022-11-10 MED ORDER — METRONIDAZOLE 500 MG PO TABS: 500.0000 mg | ORAL_TABLET | Freq: Two times a day (BID) | ORAL | 0 refills | Status: DC

## 2022-11-10 NOTE — Progress Notes (Signed)
TC reviewed lab results. Your swab shows BV. Pt familiar with BV. Reviewed treatment. Script sent to pharmacy  Carie Caddy, CNM   [facility]  11/10/22  5:40 PM

## 2022-11-18 ENCOUNTER — Other Ambulatory Visit: Payer: Self-pay | Admitting: Licensed Practical Nurse

## 2022-11-18 DIAGNOSIS — N390 Urinary tract infection, site not specified: Secondary | ICD-10-CM

## 2022-11-18 NOTE — Progress Notes (Signed)
Pt seen for annual exam, reported family hx of kidney disease, she has frequent UTI's and has had blood in urine, desired referral to nephrology,. Ref placed for Nephrology. Nephrology advised against seeing pt as her labs are WNL, rec ref to urology. Ref for urology made Jannifer Hick  [facility]  11/18/22  10:12 AM

## 2022-11-23 ENCOUNTER — Ambulatory Visit (INDEPENDENT_AMBULATORY_CARE_PROVIDER_SITE_OTHER): Payer: BC Managed Care – PPO | Admitting: Licensed Practical Nurse

## 2022-11-23 VITALS — BP 115/75 | HR 89 | Ht 62.0 in | Wt 117.0 lb

## 2022-11-23 DIAGNOSIS — Z3202 Encounter for pregnancy test, result negative: Secondary | ICD-10-CM

## 2022-11-23 DIAGNOSIS — Z30430 Encounter for insertion of intrauterine contraceptive device: Secondary | ICD-10-CM

## 2022-11-23 DIAGNOSIS — Z3040 Encounter for surveillance of contraceptives, unspecified: Secondary | ICD-10-CM

## 2022-11-23 LAB — POCT URINE PREGNANCY: Preg Test, Ur: NEGATIVE

## 2022-11-23 MED ORDER — LEVONORGESTREL 20 MCG/DAY IU IUD
1.0000 | INTRAUTERINE_SYSTEM | Freq: Once | INTRAUTERINE | Status: AC
  Administered 2022-11-23: 1 via INTRAUTERINE

## 2022-11-23 NOTE — Progress Notes (Signed)
   GYNECOLOGY OFFICE PROCEDURE NOTE  Michele Howard is a 21 y.o. G0P0000 here for Mirena IUD insertion. No GYN concerns.  Last pap not due until age 76. The patient is currently using OCP's for contraception and her LMP is Patient's last menstrual period was 11/08/2022 (approximate)..  The indication for her IUD is contraception/cycle control.  IUD Insertion Procedure Note Patient identified, informed consent performed, consent signed.   Discussed risks of irregular bleeding, cramping, infection, malpositioning, expulsion or uterine perforation of the IUD (1:1000 placements)  which may require further procedure such as laparoscopy.  IUD while effective at preventing pregnancy do not prevent transmission of sexually transmitted diseases and use of barrier methods for this purpose was discussed. Time out was performed.  Urine pregnancy test negative.  Speculum placed in the vagina.  Cervix visualized.  Cleaned with Betadine x 3.  Grasped posteriorly with a single tooth tenaculum.  Uterus sounded to 6 cm. IUD placed per manufacturer's recommendations.  Strings trimmed to 3 cm. Tenaculum was removed, good hemostasis noted.  Patient tolerated procedure well.   Patient was given post-procedure instructions.  She was advised to have backup contraception for one week.  Patient was also asked to check IUD strings periodically and follow up iPRN   Carie Caddy, CNM  [facility], [facility]

## 2022-11-24 ENCOUNTER — Encounter: Payer: Self-pay | Admitting: Licensed Practical Nurse

## 2022-12-21 ENCOUNTER — Encounter: Payer: Self-pay | Admitting: Licensed Practical Nurse

## 2023-01-10 ENCOUNTER — Encounter: Payer: Self-pay | Admitting: Licensed Practical Nurse

## 2023-02-07 ENCOUNTER — Ambulatory Visit (INDEPENDENT_AMBULATORY_CARE_PROVIDER_SITE_OTHER): Payer: BC Managed Care – PPO | Admitting: Licensed Practical Nurse

## 2023-02-07 VITALS — BP 108/87 | HR 95 | Ht 63.0 in | Wt 119.7 lb

## 2023-02-07 DIAGNOSIS — Z30431 Encounter for routine checking of intrauterine contraceptive device: Secondary | ICD-10-CM

## 2023-02-07 MED ORDER — LEVONORGESTREL 20 MCG/DAY IU IUD: 1.0000 | INTRAUTERINE_SYSTEM | Freq: Once | INTRAUTERINE | Status: AC

## 2023-02-07 NOTE — Progress Notes (Signed)
    GYNECOLOGY OFFICE ENCOUNTER NOTE  History:  22 y.o. G0P0000 here today for today for IUD string check; Mirena  IUD was placed  11/24/2023. No complaints about the IUD, no concerning side effects. She has been wearing acrylic nails so has not checked her strings.   The following portions of the patient's history were reviewed and updated as appropriate: allergies, current medications, past family history, past medical history, past social history, past surgical history and problem list. Last pap smear never collected d/t age  Review of Systems:  Pertinent items are noted in HPI.  Objective:  Physical Exam Blood pressure (!) 92/57, pulse 87, height 5\' 3"  (1.6 m), weight 119 lb 11.2 oz (54.3 kg), last menstrual period 01/29/2023. CONSTITUTIONAL: Well-developed, well-nourished female in no acute distress.  NEUROLOGIC: Alert and oriented to person, place, and time. Normal reflexes, muscle tone coordination.  ABDOMEN: Soft, no distention noted.   PELVIC: Normal appearing external genitalia; normal appearing vaginal mucosa and cervix.  IUD strings visualized, about 2-3 cm in length outside cervix. Done in the presence of a chaperone.  EXTREMITIES: Non-tender, no edema or cyanosis  Assessment & Plan:  Patient to keep IUD in place for up to 8 years; can come in for removal if she desires pregnancy earlier or for any concerning side effects.  STD screening 10/2022 declines today   RTC in about 1 year for annual exam, will need a pap at that time    Alera Quevedo, Nunzio Cobbs, CNM

## 2023-06-25 ENCOUNTER — Ambulatory Visit: Payer: BC Managed Care – PPO

## 2023-08-13 ENCOUNTER — Ambulatory Visit: Payer: BC Managed Care – PPO | Admitting: Licensed Practical Nurse

## 2023-08-17 ENCOUNTER — Other Ambulatory Visit (HOSPITAL_COMMUNITY): Admission: RE | Admit: 2023-08-17 | Payer: BC Managed Care – PPO | Source: Ambulatory Visit

## 2023-08-17 ENCOUNTER — Ambulatory Visit: Payer: BC Managed Care – PPO | Admitting: Licensed Practical Nurse

## 2023-08-17 VITALS — BP 108/74 | HR 84 | Wt 118.7 lb

## 2023-08-17 DIAGNOSIS — N920 Excessive and frequent menstruation with regular cycle: Secondary | ICD-10-CM | POA: Insufficient documentation

## 2023-08-17 DIAGNOSIS — Z113 Encounter for screening for infections with a predominantly sexual mode of transmission: Secondary | ICD-10-CM | POA: Insufficient documentation

## 2023-08-17 NOTE — Progress Notes (Signed)
  Gynecology STD Evaluation   Chief Complaint: spotting    History of Present Illness: Patient is a 22 y.o. G0P0000 presents for STD testing.  The patient has noted intermenstrual spotting that started 2 weeks ago but now has stopped,  has not experienced postcoital bleeding.  She was treated by an Urgent Care for BV about a month ago, her discharge is similar to what it looked like prior to treatment. She did have cramping for 1 day a few days ago. There is not a history of prior sexually transmitted infection(s).    She had a Mirena IUD placed in November 2023, she does get cycles. She admits that she has been under a lot of stress lately. She desires STD screening because she is not sure when was the last time she was screened.   The patient is sexually active with the same partner. She currently uses IUD: Present: yes for contraception. no, the patient also relies on condoms to prevent the spread of sexually transmitted infections.  She patient has been previously transfused or tattooed.    PMHx: She  has a past medical history of Anxiety, Depression, and Medical history non-contributory. Also,  has no past surgical history on file., family history includes Diabetes in her maternal grandfather; Thyroid disease in her paternal grandmother.,  reports that she has never smoked. She has been exposed to tobacco smoke. She has never used smokeless tobacco. She reports current drug use. Drug: Marijuana. She reports that she does not drink alcohol.  She has a current medication list which includes the following prescription(s): levofloxacin and sertraline, and the following Facility-Administered Medications: levonorgestrel. Also, has No Known Allergies.  ROS see HPI  Objective: BP 108/74   Pulse 84   Wt 118 lb 11.2 oz (53.8 kg)   BMI 21.03 kg/m  Physical Exam Constitutional:      Appearance: Normal appearance.  Genitourinary:     Vulva normal.     Genitourinary Comments: SSE: cervix pink, no  lesions. Small to moderate amount of yellow-white discharge present. IUD strings visible about 2-3 cm    Pulmonary:     Effort: Pulmonary effort is normal.  Abdominal:     Tenderness: There is no abdominal tenderness.  Neurological:     General: No focal deficit present.     Mental Status: She is alert.  Skin:    General: Skin is warm.  Psychiatric:        Mood and Affect: Mood normal.       Assessment: 21 y.o. G0P0000 spotting   Plan: Problem List Items Addressed This Visit   None Visit Diagnoses     Spotting    -  Primary   Relevant Orders   Cervicovaginal ancillary only   Screening examination for venereal disease       Relevant Orders   Cervicovaginal ancillary only   HEP, RPR, HIV Panel        1) Reviewed spotting can be a normal side effect of the Mirena 2) STI screening was collected   Carie Caddy, [place]   [facility]  08/17/23  11:38 AM

## 2023-08-18 LAB — HEP, RPR, HIV PANEL
HIV Screen 4th Generation wRfx: NONREACTIVE
Hepatitis B Surface Ag: NEGATIVE
RPR Ser Ql: NONREACTIVE

## 2023-08-20 ENCOUNTER — Other Ambulatory Visit: Payer: Self-pay | Admitting: Licensed Practical Nurse

## 2023-08-20 LAB — CERVICOVAGINAL ANCILLARY ONLY
Bacterial Vaginitis (gardnerella): NEGATIVE
Candida Glabrata: NEGATIVE
Candida Vaginitis: NEGATIVE
Chlamydia: POSITIVE — AB
Comment: NEGATIVE
Comment: NEGATIVE
Comment: NEGATIVE
Comment: NEGATIVE
Comment: NEGATIVE
Comment: NORMAL
Neisseria Gonorrhea: NEGATIVE
Trichomonas: NEGATIVE

## 2023-08-20 MED ORDER — AZITHROMYCIN 500 MG PO TABS: 1000.0000 mg | ORAL_TABLET | Freq: Once | ORAL | 1 refills | Status: AC

## 2023-08-20 NOTE — Progress Notes (Signed)
TC to [employer], Your swab shows that you have chlamydia, this is a sexually transmitted disease. Your partner does need to be treated, Leafy reports he does not have any allergies. Reviewed expedited partner treatment. Please reframe from IC until both you and your partner have taken to the medication and 7 days have gone by. [place] verbalized understanding.  Script for Azithromycin sent with 1 refill Carie Caddy, [place]  [facility]  08/20/23  4:09 PM

## 2023-10-22 ENCOUNTER — Telehealth: Payer: Self-pay | Admitting: Licensed Practical Nurse

## 2023-10-22 NOTE — Telephone Encounter (Signed)
LM for patient to call office back to schedule annual appt with LMD after 10/22/23.

## 2023-11-21 ENCOUNTER — Ambulatory Visit: Payer: BC Managed Care – PPO | Admitting: Licensed Practical Nurse

## 2023-11-21 ENCOUNTER — Other Ambulatory Visit (HOSPITAL_COMMUNITY)
Admission: RE | Admit: 2023-11-21 | Discharge: 2023-11-21 | Disposition: A | Discharge status: Home / Self Care | Payer: BC Managed Care – PPO | Source: Ambulatory Visit | Attending: Licensed Practical Nurse | Admitting: Licensed Practical Nurse

## 2023-11-21 ENCOUNTER — Encounter: Payer: Self-pay | Admitting: Licensed Practical Nurse

## 2023-11-21 VITALS — BP 100/68 | HR 60 | Ht 60.0 in | Wt 107.9 lb

## 2023-11-21 DIAGNOSIS — Z113 Encounter for screening for infections with a predominantly sexual mode of transmission: Secondary | ICD-10-CM

## 2023-11-21 DIAGNOSIS — N898 Other specified noninflammatory disorders of vagina: Secondary | ICD-10-CM | POA: Insufficient documentation

## 2023-11-21 DIAGNOSIS — Z01419 Encounter for gynecological examination (general) (routine) without abnormal findings: Secondary | ICD-10-CM

## 2023-11-21 DIAGNOSIS — Z124 Encounter for screening for malignant neoplasm of cervix: Secondary | ICD-10-CM

## 2023-11-21 DIAGNOSIS — R634 Abnormal weight loss: Secondary | ICD-10-CM

## 2023-11-21 DIAGNOSIS — R6889 Other general symptoms and signs: Secondary | ICD-10-CM

## 2023-11-21 NOTE — Progress Notes (Signed)
Gynecology Annual Exam  PCP: Otilio Connors, MD  Chief Complaint:  Chief Complaint  Patient presents with   Gynecologic Exam    History of Present Illness: Patient is a 22 y.o. G0P0000 presents for annual exam. The patient would like to be screened for STD's.   -Cold intolerance: feels cold all of the time, especially in her hands and her feet. Her coworkers have asked if she is anemic.  -Weight fluctuations: reports 6lbs weight loss, has recently started a new job where she is more active, admits she does eat often sometimes due to decreased appetite other times she is too busy, she is concerned that she is too small.  -Vaginal odor that is sweet and fishy x2 days, denies irritation or changes to her discharge. Uses Boric Acid suppository when she notices an odor.    LMP: Patient's last menstrual period was 08/20/2023 (approximate). Light spotting when she would be starting a cycle-IUD in place    The patient is sexually active with both female and female partners, has had 4 in the last 3 months.  Currently exclusive with 1 female partner. She currently uses IUD for contraception and condoms occasionally. She denies dyspareunia.  The patient does not perform self breast exams.  There is no notable family history of breast or ovarian cancer in her family.  The patient wears seatbelts: yes.  The patient has regular exercise:  is active at work  .    The patient denies current symptoms of depression.  Has struggle with depression in the past, feels "fine" now. Admits to experiencing anxiety. She will have episodes where she is easily irritated followed by crying-when this happens she goes to a quiet place to settle down. This happens about 2 times a month, Paul feels this is very manageable. Would consider medication if it was worse.  -Works in [employer] -Lives with her mom and dad -Wear glasses, last exam 3 years ago -Dental, last exam February   Review of Systems: ROS see HPI  Past  Medical History:  Patient Active Problem List   Diagnosis Date Noted Date Diagnosed   MDD (major depressive disorder), single episode, severe , no psychosis (HCC) 10/31/2015     Past Surgical History:  History reviewed. No pertinent surgical history.  Gynecologic History:  Patient's last menstrual period was 08/20/2023 (approximate). Contraception: IUD Last Pap: Results were: first ever collected today  Obstetric History: G0P0000  Family History:  Family History  Problem Relation Age of Onset   Diabetes Maternal Grandfather    Thyroid disease Paternal Grandmother     Social History:  Social History   Socioeconomic History   Marital status: Single    Spouse name: Not on file   Number of children: Not on file   Years of education: Not on file   Highest education level: Not on file  Occupational History   Not on file  Tobacco Use   Smoking status: Never    Passive exposure: Yes   Smokeless tobacco: Never  Substance and Sexual Activity   Alcohol use: Yes    Comment: occ   Drug use: Yes    Types: Marijuana   Sexual activity: Yes    Birth control/protection: I.U.D.  Other Topics Concern   Not on file  Social History Narrative   Not on file   Social Determinants of Health   Financial Resource Strain: Not on file  Food Insecurity: Not on file  Transportation Needs: Not on file  Physical Activity: Not on file  Stress: Not on file  Social Connections: Not on file  Intimate Partner Violence: Not on file    Allergies:  No Known Allergies  Medications: Prior to Admission medications   Medication Sig Start Date End Date Taking? Authorizing Provider    Physical Exam Vitals: Blood pressure 100/68, pulse 60, height 5' (1.524 m), weight 107 lb 14.4 oz (48.9 kg), last menstrual period 08/20/2023.  General: NAD HEENT: normocephalic, anicteric Thyroid: no enlargement, no palpable nodules Pulmonary: No increased work of breathing, CTAB Cardiovascular: RRR, distal  pulses 2+ Breast: Breast symmetrical, no tenderness, no palpable nodules or masses, no skin or nipple retraction present, no nipple discharge.  No axillary or supraclavicular lymphadenopathy. Abdomen: NABS, soft, non-tender, non-distended.  Umbilicus without lesions.  No hepatomegaly, splenomegaly or masses palpable. No evidence of hernia  Genitourinary:  External: Normal external female genitalia.  Normal urethral meatus, normal Bartholin's and Skene's glands.    Vagina: Normal vaginal mucosa, no evidence of prolapse.    Cervix: Grossly normal in appearance, no bleeding, IUD strings visualized.   Uterus: Non-enlarged, mobile, normal contour.  No CMT  Adnexa: ovaries non-enlarged, no adnexal masses  Rectal: deferred  Lymphatic: no evidence of inguinal lymphadenopathy Extremities: no edema, erythema, or tenderness Neurologic: Grossly intact Psychiatric: mood appropriate, affect full   Assessment: 22 y.o. G0P0000 routine annual exam  Plan: Problem List Items Addressed This Visit   None Visit Diagnoses     Well woman exam    -  Primary   Relevant Orders   Cytology - PAP   Cervicovaginal ancillary only   HEP, RPR, HIV Panel   CBC   TSH + free T4   Amb ref to Medical Nutrition Therapy-MNT   Vaginal odor       Relevant Orders   Cervicovaginal ancillary only   Screening examination for venereal disease       Relevant Orders   Cervicovaginal ancillary only   HEP, RPR, HIV Panel   Cervical cancer screening       Relevant Orders   Cytology - PAP   Cold intolerance       Relevant Orders   CBC   TSH + free T4   Weight loss       Relevant Orders   CBC   TSH + free T4   Amb ref to Medical Nutrition Therapy-MNT       1) 4) Gardasil Series discussed and if applicable offered to patient - Patient has previously completed 3 shot series   2) STI screening  wasoffered and accepted  3)  ASCCP guidelines and rational discussed.  Patient opts for every 3 years screening  interval  4) Contraception - the patient is currently using  IUD.  She is happy with her current form of contraception and plans to continue We discussed safe sex practices to reduce her furture risk of STI's.    5) Will return for blood work, pt has not yet eaten today, would prefer to eat prior to blood draw.    Carie Caddy, CNM  [facility] 11/21/2023, 12:18 PM

## 2023-11-23 LAB — CERVICOVAGINAL ANCILLARY ONLY
Bacterial Vaginitis (gardnerella): POSITIVE — AB
Candida Glabrata: NEGATIVE
Candida Vaginitis: NEGATIVE
Chlamydia: NEGATIVE
Comment: NEGATIVE
Comment: NEGATIVE
Comment: NEGATIVE
Comment: NEGATIVE
Comment: NEGATIVE
Comment: NORMAL
Neisseria Gonorrhea: NEGATIVE
Trichomonas: NEGATIVE

## 2023-11-25 ENCOUNTER — Other Ambulatory Visit: Payer: Self-pay | Admitting: Licensed Practical Nurse

## 2023-11-25 MED ORDER — METRONIDAZOLE 500 MG PO TABS: 500.0000 mg | ORAL_TABLET | Freq: Two times a day (BID) | ORAL | 0 refills | Status: DC

## 2023-11-26 LAB — CYTOLOGY - PAP: Diagnosis: NEGATIVE

## 2024-07-28 ENCOUNTER — Encounter: Payer: Self-pay | Admitting: Licensed Practical Nurse

## 2024-07-30 ENCOUNTER — Other Ambulatory Visit (HOSPITAL_COMMUNITY)
Admission: RE | Admit: 2024-07-30 | Discharge: 2024-07-30 | Disposition: A | Discharge status: Home / Self Care | Source: Ambulatory Visit | Attending: Obstetrics and Gynecology | Admitting: Obstetrics and Gynecology

## 2024-07-30 ENCOUNTER — Ambulatory Visit

## 2024-07-30 VITALS — BP 96/6 | HR 72 | Ht 60.0 in | Wt 115.0 lb

## 2024-07-30 DIAGNOSIS — R634 Abnormal weight loss: Secondary | ICD-10-CM

## 2024-07-30 DIAGNOSIS — N898 Other specified noninflammatory disorders of vagina: Secondary | ICD-10-CM | POA: Insufficient documentation

## 2024-07-30 DIAGNOSIS — Z113 Encounter for screening for infections with a predominantly sexual mode of transmission: Secondary | ICD-10-CM | POA: Diagnosis present

## 2024-07-30 NOTE — Progress Notes (Signed)
    NURSE VISIT NOTE  Subjective:    Patient ID: Michele Howard, female    DOB: May 03, 2001, 23 y.o.   MRN: [id number]  HPI  Patient is a 23 y.o. G0P0000 female who presents for white and green vaginal discharge for 2 week(s). Denies abnormal vaginal bleeding or significant pelvic pain or fever. denies dysuria, hematuria, urinary frequency, urinary urgency, flank pain, abdominal pain, pelvic pain, cloudy malordorous urine, genital rash, and genital irritation. Patient admits to history of known exposure to STD. Chlamydia once and had no symptoms. Vaginal rash and wanted HSV testing added.   Objective:    BP (!) 96/6   Pulse 72   Ht 5' (1.524 m)   Wt 115 lb (52.2 kg)   BMI 22.46 kg/m    @THIS  VISIT ONLY@  Assessment:   1. Vaginal discharge   2. Weight loss   3. Screening examination for venereal disease       Plan:   GC and chlamydia DNA  probe sent to lab. Treatment: abstain from coitus during course of treatment ROV prn if symptoms persist or worsen.   Harlene Gander, CMA

## 2024-07-31 ENCOUNTER — Ambulatory Visit: Payer: Self-pay

## 2024-07-31 ENCOUNTER — Other Ambulatory Visit: Payer: Self-pay

## 2024-07-31 LAB — CERVICOVAGINAL ANCILLARY ONLY
Bacterial Vaginitis (gardnerella): NEGATIVE
Candida Glabrata: NEGATIVE
Candida Vaginitis: NEGATIVE
Chlamydia: POSITIVE — AB
Comment: NEGATIVE
Comment: NEGATIVE
Comment: NEGATIVE
Comment: NEGATIVE
Comment: NEGATIVE
Comment: NORMAL
Neisseria Gonorrhea: NEGATIVE
Trichomonas: NEGATIVE

## 2024-07-31 LAB — CBC
Hematocrit: 41.4 % (ref 34.0–46.6)
Hemoglobin: 13.6 g/dL (ref 11.1–15.9)
MCH: 30.3 pg (ref 26.6–33.0)
MCHC: 32.9 g/dL (ref 31.5–35.7)
MCV: 92 fL (ref 79–97)
Platelets: 279 x10E3/uL (ref 150–450)
RBC: 4.49 x10E6/uL (ref 3.77–5.28)
RDW: 12.6 % (ref 11.7–15.4)
WBC: 6.9 x10E3/uL (ref 3.4–10.8)

## 2024-07-31 LAB — TSH+FREE T4
Free T4: 1.27 ng/dL (ref 0.82–1.77)
TSH: 0.99 u[IU]/mL (ref 0.450–4.500)

## 2024-07-31 LAB — HSV 1 AND 2 AB, IGG
HSV 1 Glycoprotein G Ab, IgG: NONREACTIVE
HSV 2 IgG, Type Spec: NONREACTIVE

## 2024-07-31 LAB — HEP, RPR, HIV PANEL
HIV Screen 4th Generation wRfx: NONREACTIVE
Hepatitis B Surface Ag: NEGATIVE
RPR Ser Ql: NONREACTIVE

## 2024-07-31 MED ORDER — DOXYCYCLINE HYCLATE 100 MG PO TABS: 100.0000 mg | ORAL_TABLET | Freq: Two times a day (BID) | ORAL | 1 refills | Status: AC

## 2024-07-31 NOTE — Addendum Note (Signed)
 Addended by: KIZZIE CAMELIA CROME on: 07/31/2024 04:11 PM   Modules accepted: Orders

## 2024-07-31 NOTE — Patient Instructions (Signed)
Expedited Partner Therapy:  Information Sheet for Patients and Partners               You have been offered expedited partner therapy (EPT). This information sheet contains important information and warnings you need to be aware of, so please read it carefully.   Expedited Partner Therapy (EPT) is the clinical practice of treating the sexual partners of persons who receive chlamydia, gonorrhea, or trichomoniasis diagnoses by providing medications or prescriptions to the patient. Patients then provide partners with these therapies without the health-care provider having examined the partner. In other words, EPT is a convenient, fast and private way for patients to help their sexual partners get treated.   Chlamydia and gonorrhea are bacterial infections you get from having sex with a person who is already infected. Trichomoniasis (or "trich") is a very common sexually transmitted infection (STI) that is caused by infection with a protozoan parasite called Trichomonas vaginalis.  Many people with these infections don't know it because they feel fine, but without treatment these infections can cause serious health problems, such as pelvic inflammatory disease, ectopic pregnancy, infertility and increased risk of HIV.   It is important to get treated as soon as possible to protect your health, to avoid spreading these infections to others, and to prevent yourself from becoming re-infected. The good news is these infections can be easily cured with proper antibiotic medicine. The best way to take care of your self is to see a doctor or go to your local health department. If you are not able to see a doctor or other medical provider, you should take EPT.    Recommended Medication: EPT for Chlamydia:  Azithromycin (Zithromax) 1 gram orally in a single dose EPT for Gonorrhea:  Cefixime (Suprax) 400 milligrams orally in a single dose PLUS azithromycin (Zithromax) 1 gram orally in a single dose EPT for  Trichomoniasis:  Metronidazole (Flagyl) 2 grams orally in a single dose   These medicines are very safe. However, you should not take them if you have ever had an allergic reaction (like a rash) to any of these medicines: azithromycin (Zithromax), erythromycin, clarithromycin (Biaxin), metronidazole (Flagyl), tinidazole (Tindimax). If you are uncertain about whether you have an allergy, call your medical provider or pharmacist before taking this medicine. If you have a serious, long-term illness like kidney, liver or heart disease, colitis or stomach problems, or you are currently taking other prescription medication, talk to your provider before taking this medication.   Women: If you have lower belly pain, pain during sex, vomiting, or a fever, do not take this medicine. Instead, you should see a medical provider to be certain you do not have pelvic inflammatory disease (PID). PID can be serious and lead to infertility, pregnancy problems or chronic pelvic pain.   Pregnant Women: It is very important for you to see a doctor to get pregnancy services and pre-natal care. These antibiotics for EPT are safe for pregnant women, but you still need to see a medical provider as soon as possible. It is also important to note that Doxycycline is an alternative therapy for chlamydia, but it should not be taken by someone who is pregnant.   Men: If you have pain or swelling in the testicles or a fever, do not take this medicine and see a medical provider.     Men who have sex with men (MSM): MSM in [place] continue to experience high rates of syphilis and HIV. Many MSM with gonorrhea or  chlamydia could also have syphilis and/or HIV and not know it. If you are a man who has sex with other men, it is very important that you see a medical provider and are tested for HIV and syphilis. EPT is not recommended for gonorrhea for MSM.  Recommended treatment for gonorrhea for MSM is Rocephin (shot) AND azithromycin  due to decreased cure rate.  Please see your medical provider if this is the case.    Along with this information sheet is a prescription for the medicine. If you receive a prescription it will be in your name and will indicate your date of birth, or it will be in the name of "Expedited Partner Therapy".   In either case, you can have the prescription filled at a pharmacy. You will be responsible for the cost of the medicine, unless you have prescription drug coverage. In that case, you could provide your name so the pharmacy could bill your health plan.   Take the medication as directed. Some people will have a mild, upset stomach, which does not last long. AVOID alcohol 24 hours after taking metronidazole (Flagyl) to reduce the possibility of a disulfiram-like reaction (severe vomiting and abdominal pain).  After taking the medicine, do not have sex for 7 days. Do not share this medicine or give it to anyone else. It is important to tell everyone you have had sex with in the last 60 days that they need to go and get tested for sexually transmitted infections.   Ways to prevent these and other sexually transmitted infections (STIs):    Abstain from sex. This is the only sure way to avoid getting an STI.   Use barrier methods, such as condoms, consistently and correctly.   Limit the number of sexual partners.   Have regular physical exams, including testing for STIs.   For more information about EPT or other issues pertaining to an STI, please contact your medical provider or the  [facility] Department at [phone] or [email or web address]   [facility] Department - main number [phone] Appointment [phone] or [email or web address]

## 2024-11-28 ENCOUNTER — Encounter: Payer: Self-pay | Admitting: Licensed Practical Nurse

## 2024-12-04 ENCOUNTER — Other Ambulatory Visit (HOSPITAL_COMMUNITY)
Admission: RE | Admit: 2024-12-04 | Discharge: 2024-12-04 | Disposition: A | Source: Ambulatory Visit | Attending: Certified Nurse Midwife | Admitting: Certified Nurse Midwife

## 2024-12-04 ENCOUNTER — Ambulatory Visit

## 2024-12-04 VITALS — BP 113/76 | HR 74 | Wt 116.3 lb

## 2024-12-04 DIAGNOSIS — N898 Other specified noninflammatory disorders of vagina: Secondary | ICD-10-CM

## 2024-12-04 NOTE — Progress Notes (Signed)
° ° °  NURSE VISIT NOTE  Subjective:    Patient ID: Michele Howard, female    DOB: January 15, 2001, 23 y.o.   MRN: [id number]  HPI  Patient is a 23 y.o. G0P0000 female who presents for white vaginal discharge and odor for 1 month(s). Denies abnormal vaginal bleeding or significant pelvic pain or fever. Patient has a history of known exposure to STD.   Objective:    BP 113/76   Pulse 74   Wt 116 lb 4.8 oz (52.8 kg)   BMI 22.71 kg/m      Assessment:     Plan:   GC and chlamydia DNA  probe sent to lab. Treatment: waiting on results  ROV prn if symptoms persist or worsen.   Burnard LITTIE Ro, CMA

## 2024-12-08 ENCOUNTER — Ambulatory Visit: Payer: Self-pay | Admitting: Certified Nurse Midwife

## 2024-12-08 LAB — CERVICOVAGINAL ANCILLARY ONLY
Bacterial Vaginitis (gardnerella): POSITIVE — AB
Candida Glabrata: NEGATIVE
Candida Vaginitis: NEGATIVE
Chlamydia: NEGATIVE
Comment: NEGATIVE
Comment: NEGATIVE
Comment: NEGATIVE
Comment: NEGATIVE
Comment: NEGATIVE
Comment: NORMAL
Neisseria Gonorrhea: NEGATIVE
Trichomonas: NEGATIVE

## 2024-12-08 MED ORDER — METRONIDAZOLE 500 MG PO TABS: 500.0000 mg | ORAL_TABLET | Freq: Two times a day (BID) | ORAL | 0 refills | Status: AC
